# Patient Record
Sex: Female | Born: 1950 | Race: White | Hispanic: No | Marital: Single | Smoking: Current every day smoker
Health system: Southern US, Community
[De-identification: ages and names within clinical notes are randomized; demographics above are authoritative.]

## PROBLEM LIST (undated history)

## (undated) DIAGNOSIS — J40 Bronchitis, not specified as acute or chronic: Secondary | ICD-10-CM

## (undated) DIAGNOSIS — I493 Ventricular premature depolarization: Secondary | ICD-10-CM

## (undated) DIAGNOSIS — IMO0002 Reserved for concepts with insufficient information to code with codable children: Secondary | ICD-10-CM

## (undated) DIAGNOSIS — H6990 Unspecified Eustachian tube disorder, unspecified ear: Secondary | ICD-10-CM

## (undated) DIAGNOSIS — E039 Hypothyroidism, unspecified: Secondary | ICD-10-CM

## (undated) DIAGNOSIS — Z8639 Personal history of other endocrine, nutritional and metabolic disease: Secondary | ICD-10-CM

## (undated) DIAGNOSIS — M329 Systemic lupus erythematosus, unspecified: Secondary | ICD-10-CM

## (undated) DIAGNOSIS — K219 Gastro-esophageal reflux disease without esophagitis: Secondary | ICD-10-CM

## (undated) DIAGNOSIS — H6980 Other specified disorders of Eustachian tube, unspecified ear: Secondary | ICD-10-CM

## (undated) DIAGNOSIS — I499 Cardiac arrhythmia, unspecified: Secondary | ICD-10-CM

## (undated) DIAGNOSIS — T884XXA Failed or difficult intubation, initial encounter: Secondary | ICD-10-CM

## (undated) HISTORY — DX: Unspecified eustachian tube disorder, unspecified ear: H69.90

## (undated) HISTORY — PX: MANDIBLE SURGERY: SHX707

## (undated) HISTORY — PX: LAPAROSCOPIC GASTRIC BANDING WITH HIATAL HERNIA REPAIR: SHX6351

## (undated) HISTORY — PX: LAPAROSCOPIC GASTRIC BANDING: SHX1100

## (undated) HISTORY — DX: Personal history of other endocrine, nutritional and metabolic disease: Z86.39

## (undated) HISTORY — PX: LUMBAR LAMINECTOMY: SHX95

## (undated) HISTORY — PX: TONSILLECTOMY: SUR1361

## (undated) HISTORY — DX: Other specified disorders of Eustachian tube, unspecified ear: H69.80

## (undated) HISTORY — PX: KNEE SURGERY: SHX244

---
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
## 2003-09-30 ENCOUNTER — Emergency Department (HOSPITAL_COMMUNITY): Admission: EM | Admit: 2003-09-30 | Discharge: 2003-09-30 | Payer: Self-pay | Admitting: Emergency Medicine

## 2004-06-11 ENCOUNTER — Encounter: Admission: RE | Admit: 2004-06-11 | Discharge: 2004-06-11 | Payer: Self-pay | Admitting: Surgery

## 2004-10-21 ENCOUNTER — Ambulatory Visit (HOSPITAL_COMMUNITY): Admission: RE | Admit: 2004-10-21 | Discharge: 2004-10-21 | Payer: Self-pay | Admitting: Surgery

## 2004-11-17 ENCOUNTER — Encounter (INDEPENDENT_AMBULATORY_CARE_PROVIDER_SITE_OTHER): Payer: Self-pay | Admitting: *Deleted

## 2004-11-18 ENCOUNTER — Ambulatory Visit (HOSPITAL_COMMUNITY): Admission: RE | Admit: 2004-11-18 | Discharge: 2004-11-18 | Payer: Self-pay | Admitting: Surgery

## 2005-01-18 ENCOUNTER — Encounter: Admission: RE | Admit: 2005-01-18 | Discharge: 2005-04-18 | Payer: Self-pay | Admitting: Surgery

## 2005-03-07 ENCOUNTER — Observation Stay (HOSPITAL_COMMUNITY): Admission: RE | Admit: 2005-03-07 | Discharge: 2005-03-08 | Payer: Self-pay | Admitting: Surgery

## 2005-05-27 ENCOUNTER — Encounter: Admission: RE | Admit: 2005-05-27 | Discharge: 2005-08-25 | Payer: Self-pay | Admitting: Surgery

## 2007-02-08 ENCOUNTER — Ambulatory Visit: Payer: Self-pay | Admitting: Cardiology

## 2007-02-16 ENCOUNTER — Ambulatory Visit: Payer: Self-pay

## 2009-06-26 ENCOUNTER — Encounter: Admission: RE | Admit: 2009-06-26 | Discharge: 2009-06-26 | Payer: Self-pay | Admitting: Surgery

## 2010-01-27 ENCOUNTER — Ambulatory Visit: Payer: Self-pay | Admitting: Infectious Diseases

## 2010-01-27 ENCOUNTER — Inpatient Hospital Stay (HOSPITAL_COMMUNITY): Admission: AD | Admit: 2010-01-27 | Discharge: 2010-01-29 | Payer: Self-pay | Admitting: Surgery

## 2010-02-05 ENCOUNTER — Ambulatory Visit: Payer: Self-pay | Admitting: Infectious Diseases

## 2010-02-05 DIAGNOSIS — M329 Systemic lupus erythematosus, unspecified: Secondary | ICD-10-CM

## 2010-02-05 DIAGNOSIS — L0293 Carbuncle, unspecified: Secondary | ICD-10-CM

## 2010-02-05 DIAGNOSIS — L0292 Furuncle, unspecified: Secondary | ICD-10-CM | POA: Insufficient documentation

## 2010-02-05 DIAGNOSIS — E890 Postprocedural hypothyroidism: Secondary | ICD-10-CM | POA: Insufficient documentation

## 2010-02-05 DIAGNOSIS — E039 Hypothyroidism, unspecified: Secondary | ICD-10-CM

## 2010-02-05 DIAGNOSIS — K219 Gastro-esophageal reflux disease without esophagitis: Secondary | ICD-10-CM | POA: Insufficient documentation

## 2010-02-05 DIAGNOSIS — L930 Discoid lupus erythematosus: Secondary | ICD-10-CM | POA: Insufficient documentation

## 2010-07-20 ENCOUNTER — Telehealth: Payer: Self-pay | Admitting: Infectious Diseases

## 2010-10-07 NOTE — Assessment & Plan Note (Signed)
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 Her immunosuppresive therapy was changed from plaquenil to methotrexate. After her 3 doses she developed significant swelling of her occipital lymph nodes. 2 weeks later she was adm to [facility] and had local I & D 01-27-10 which was polymicrobic (no staph or strep). It was actually listed as "normal vaginal flora". She was d/c home on Badtrim 2 DS two times a day and now returns with continued d/c from her wound. no f/c.   Preventive Screening-Counseling & Management  Alcohol-Tobacco     Smoking Status: current  Past History:  Past Medical History: Discoid Lupus GERD- with Banding Hypothyroidism Funcule- Cx negative Feb 27, 2010  Family History: father died of glioblastoma  Social History: Occupation: Education officer, community Single Alcohol use-yes, 1-2 drinks/wk Current Smoker- >58yrs, <1ppd cigarrettes/day   Review of Systems       no yeast infecions, has had soreness in R ear and sore throat on R. no joint aches.   Vital Signs:  Patient profile:   60 year old female Height:      65 inches (165.10 cm) Weight:      202.8 pounds (92.18 kg) BMI:     33.87 Temp:     98.4 degrees F (36.89 degrees C) oral Pulse rate:   91 / minute BP sitting:   160 / 88  (right arm)  Vitals Entered By: Baxter Hire) (February 05, 2010 9:59 AM)  Physical Exam  General:  well-developed, well-nourished, and well-hydrated.   Eyes:  pupils equal, pupils round, and pupils reactive to light.   Mouth:  pharynx pink and moist and no exudates.   Lungs:  normal respiratory effort and normal breath sounds.   Heart:  normal rate, regular rhythm, and no murmur.   Abdomen:  soft, non-tender, and normal bowel sounds.   Skin:  she has thickened erythematous area over her R elbow.  Her lesion on her R occiput is significantly smaller, decreased erythema, non-tender, no fluctuance.    Impression & Recommendations:  Problem # 1:   FURUNCLE (ICD-680.9)  She is doing much better. She will complete her 2 weeks of by mouth 2 tabs Bactrim DS two times a day. I am not sure why her cx's were negative. It could be that she had a necrotic lymph node from immune reaction from her new lupus therapy. Her exam was most consistent with a MRSA furuncle however. At work, she need to continue to keep the wound covered and practice good hand washing. This is also true if she is around any immunocompromised patients.  she will returnt to the ID clinic as needed.   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 She is not happy with her previous f/u. I have given her the name and phone # for a rheumatologist (Will Truslow). She will think about this.   Orders: New Patient Level IV ([id number])  Medications Added to Medication List This Visit: 1)  Synthroid 25 Mcg Tabs (Levothyroxine sodium) .... Take one tablet by mouth once a day. 2)  Bactrim Ds 800-160 Mg Tabs (Sulfamethoxazole-trimethoprim) .... 2 two times a day

## 2010-10-07 NOTE — Progress Notes (Signed)
  Phone Note Call from Patient Call back at [phone]   Caller: [phone] Summary of Call: Patient called stating that she has another furuncle on her forehead, and she wants to know if she can get some abx before it gets worse. Not  Doxycycline because she is running a marathon. [phone] Initial call taken by: Starleen Arms CMA,  July 20, 2010 9:42 AM    New/Updated Medications: BACTRIM DS 800-160 MG TABS (SULFAMETHOXAZOLE-TRIMETHOPRIM) Take 1 tablet by mouth two times a day Prescriptions: BACTRIM DS 800-160 MG TABS (SULFAMETHOXAZOLE-TRIMETHOPRIM) Take 1 tablet by mouth two times a day  #28 x 1   Entered and Authorized by:   Johny Sax MD   Signed by:   Johny Sax MD on 07/27/2010   Method used:   Print then Give to Patient   RxID:   [id number]

## 2010-10-08 NOTE — Miscellaneous (Signed)
Summary: HIPAA Restrictions  HIPAA Restrictions   Imported By: Florinda Marker 02/05/2010 12:01:58  _____________________________________________________________________  External Attachment:    Type:   Image     Comment:   External Document

## 2010-11-22 LAB — DIFFERENTIAL
Basophils Absolute: 0 10*3/uL (ref 0.0–0.1)
Basophils Absolute: 0 10*3/uL (ref 0.0–0.1)
Basophils Relative: 0 % (ref 0–1)
Basophils Relative: 1 % (ref 0–1)
Eosinophils Absolute: 0.2 10*3/uL (ref 0.0–0.7)
Eosinophils Absolute: 0.2 10*3/uL (ref 0.0–0.7)
Eosinophils Relative: 2 % (ref 0–5)
Eosinophils Relative: 5 % (ref 0–5)
Lymphocytes Relative: 17 % (ref 12–46)
Lymphocytes Relative: 33 % (ref 12–46)
Lymphs Abs: 1.5 10*3/uL (ref 0.7–4.0)
Lymphs Abs: 1.6 10*3/uL (ref 0.7–4.0)
Monocytes Absolute: 0.7 10*3/uL (ref 0.1–1.0)
Monocytes Absolute: 0.8 10*3/uL (ref 0.1–1.0)
Monocytes Relative: 14 % — ABNORMAL HIGH (ref 3–12)
Monocytes Relative: 9 % (ref 3–12)
Neutro Abs: 2.2 10*3/uL (ref 1.7–7.7)
Neutro Abs: 6.4 10*3/uL (ref 1.7–7.7)
Neutrophils Relative %: 47 % (ref 43–77)
Neutrophils Relative %: 71 % (ref 43–77)

## 2010-11-22 LAB — CBC
HCT: 33.3 % — ABNORMAL LOW (ref 36.0–46.0)
HCT: 34.9 % — ABNORMAL LOW (ref 36.0–46.0)
HCT: 36 % (ref 36.0–46.0)
Hemoglobin: 11.2 g/dL — ABNORMAL LOW (ref 12.0–15.0)
Hemoglobin: 11.5 g/dL — ABNORMAL LOW (ref 12.0–15.0)
Hemoglobin: 11.9 g/dL — ABNORMAL LOW (ref 12.0–15.0)
MCHC: 32.9 g/dL (ref 30.0–36.0)
MCHC: 33 g/dL (ref 30.0–36.0)
MCHC: 33.8 g/dL (ref 30.0–36.0)
MCV: 86.7 fL (ref 78.0–100.0)
MCV: 87.3 fL (ref 78.0–100.0)
MCV: 87.6 fL (ref 78.0–100.0)
Platelets: 263 10*3/uL (ref 150–400)
Platelets: 272 10*3/uL (ref 150–400)
Platelets: 273 10*3/uL (ref 150–400)
RBC: 3.84 MIL/uL — ABNORMAL LOW (ref 3.87–5.11)
RBC: 4 MIL/uL (ref 3.87–5.11)
RBC: 4.11 MIL/uL (ref 3.87–5.11)
RDW: 15.6 % — ABNORMAL HIGH (ref 11.5–15.5)
RDW: 15.7 % — ABNORMAL HIGH (ref 11.5–15.5)
RDW: 15.8 % — ABNORMAL HIGH (ref 11.5–15.5)
WBC: 4.7 10*3/uL (ref 4.0–10.5)
WBC: 7.7 10*3/uL (ref 4.0–10.5)
WBC: 8.9 10*3/uL (ref 4.0–10.5)

## 2010-11-22 LAB — COMPREHENSIVE METABOLIC PANEL
ALT: 10 U/L (ref 0–35)
ALT: 10 U/L (ref 0–35)
AST: 11 U/L (ref 0–37)
AST: 12 U/L (ref 0–37)
Albumin: 3.3 g/dL — ABNORMAL LOW (ref 3.5–5.2)
Albumin: 3.4 g/dL — ABNORMAL LOW (ref 3.5–5.2)
Alkaline Phosphatase: 62 U/L (ref 39–117)
Alkaline Phosphatase: 65 U/L (ref 39–117)
BUN: 7 mg/dL (ref 6–23)
BUN: 8 mg/dL (ref 6–23)
CO2: 23 mEq/L (ref 19–32)
CO2: 24 mEq/L (ref 19–32)
Calcium: 8.9 mg/dL (ref 8.4–10.5)
Calcium: 9 mg/dL (ref 8.4–10.5)
Chloride: 108 mEq/L (ref 96–112)
Chloride: 110 mEq/L (ref 96–112)
Creatinine, Ser: 0.75 mg/dL (ref 0.4–1.2)
Creatinine, Ser: 0.82 mg/dL (ref 0.4–1.2)
GFR calc Af Amer: >60 mL/min (ref >60)
GFR calc Af Amer: >60 mL/min (ref >60)
GFR calc non Af Amer: >60 mL/min (ref >60)
GFR calc non Af Amer: >60 mL/min (ref >60)
Glucose, Bld: 101 mg/dL — ABNORMAL HIGH (ref 70–99)
Glucose, Bld: 104 mg/dL — ABNORMAL HIGH (ref 70–99)
Potassium: 3.6 mEq/L (ref 3.5–5.1)
Potassium: 3.8 mEq/L (ref 3.5–5.1)
Sodium: 140 mEq/L (ref 135–145)
Sodium: 142 mEq/L (ref 135–145)
Total Bilirubin: 0.5 mg/dL (ref 0.3–1.2)
Total Bilirubin: 0.6 mg/dL (ref 0.3–1.2)
Total Protein: 6.3 g/dL (ref 6.0–8.3)
Total Protein: 6.4 g/dL (ref 6.0–8.3)

## 2010-11-22 LAB — CULTURE, ROUTINE-ABSCESS: Culture: NORMAL

## 2010-11-22 LAB — PROTIME-INR
INR: 1.08 (ref 0.00–1.49)
Prothrombin Time: 13.9 seconds (ref 11.6–15.2)

## 2010-11-22 LAB — APTT: aPTT: 32 seconds (ref 24–37)

## 2010-11-22 LAB — SEDIMENTATION RATE: Sed Rate: 27 mm/hr — ABNORMAL HIGH (ref 0–22)

## 2010-11-22 LAB — VANCOMYCIN, TROUGH: Vancomycin Tr: 21.5 ug/mL — ABNORMAL HIGH (ref 10.0–20.0)

## 2010-12-14 ENCOUNTER — Encounter: Payer: Self-pay | Admitting: *Deleted

## 2011-01-18 NOTE — Assessment & Plan Note (Signed)
[facility]                            CARDIOLOGY OFFICE NOTE   Angela Scott, Angela Scott                      MRN:          [id number]  DATE:02/08/2007                            DOB:          02/15/1951    PRIMARY CARE PHYSICIAN:  Suszanne Conners, PA-C, with [facility].   REASON FOR CONSULTATION:  Abnormal electrocardiogram.   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  She  has no long standing history of hypertension, hyperlipidemia (reported  total cholesterol around 100), or type 2 diabetes mellitus.  She  recently applied for term life insurance and had a paramedical  examination which included an electrocardiogram that was interpreted as  being abnormal, showing T-wave changes.  I have electrocardiograms today  in the office to review, one dated May the 30th, the other dated April  the 4th, the final one not dated.  The most recent tracing shows sinus  rhythm with an R-prime in V1 and T-wave inversions in the anteroseptal  leads.  There is also a leftward axis, but not frank Q-waves in the  inferior leads.  Generally nonspecific T-wave changes are noted.  The  other tracings show similar changes, although perhaps with less  prominent T-wave changes in general.  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   Symptomatically, the patient denies any clear history of exertional  angina or limiting dyspnea on exertion.  She states that she does some  type of aerobic exercise 2-3 times a week, and notes no chest pain or  breathlessness with this.  She reports that she has occasional  palpitations that are brief and not associated with any dizziness or  syncope.  She has had no prior cardiac risk stratification.   ALLERGIES:   ABSORBABLE SUTURES.  NO FRANK DRUG ALLERGIES.   PRESENT MEDICATIONS:  1. Synthroid 0.25 mg p.o. q.i.d.  2. Cytomel 25 mg p.o. q.p.m.  3. She also takes an occasional multivitamin.   SOCIAL HISTORY:  The patient is single.  She has no children.  She is a  Education officer, community.  She has a 20-year history of less than a pack per day  cigarette use.  Drinks 1 or 2 alcoholic beverages weekly and a few cups  of caffeinated coffee a day.  Reports her exercise as being aerobic,  using a glider, elliptical machine, or walking in general.   FAMILY HISTORY:  Noncontributory for premature cardiovascular disease.  She states that her father died at age 81 with a glioblastoma.  She has  a brother who underwent an electrophysiology ablation in his 32s,  apparently due to some type of preexcitation syndrome (details not  known).   REVIEW OF SYSTEMS:  As described in the history of present illness.  She  has a prior history of hiatal hernia repair, and some mild arthritic  discomfort.  No major reflux symptoms at this time.  PAST MEDICAL HISTORY:  Is as outlined above.  Possible history of  Grave's disease/thyroid disease as outlined.  She did have prior  orthopedic surgery for a torn tendon in her ankle back in 1995.   PHYSICAL EXAMINATION:  Blood pressure is 132/80, heart rate is 81,  weight is 216 pounds.  An overweight woman in no acute distress.  HEENT:  Conjunctivae, lids, grossly normal, oropharynx clear.  NECK:  Supple.  No elevated jugular venous pressure or loud bruits.  Normal carotid pulses.  No thyromegaly or thyroid tenderness is noted.  LUNGS:  Clear without labored breathing at rest.  CARDIAC EXAM:  Reveals a regular rate and rhythm.  No loud murmur,  pericardial rub or S3 gallop.  ABDOMEN:  Soft, nontender.  Normoactive bowel sounds.  No bruits.  EXTREMITIES:  Exhibit no significant pitting edema.  Distal pulses are  2+.  SKIN:  Warm and dry.  MUSCULOSKELETAL:  No kyphosis is noted.   NEURO/PSYCHIATRIC:  The patient is alert and oriented x3.  Affect is  normal.   IMPRESSION/RECOMMENDATIONS:  1. Mildly abnormal resting electrocardiogram, essentially nonspecific      with T-wave inversions as described.  This is in a 60 year old      woman with a history of tobacco use, post menopausal, and mildly      overweight.  She has had no prior cardiac risk stratification.  The      patient is asymptomatic from the perspective of exertional angina      or dyspnea.  It does seem reasonable to proceed with basic cardiac      risk stratification, and we discussed this today.  We will plan an      exercise Myoview, presuming that this is low risk, I would not      anticipate any additional cardiac studies in the absence of      symptoms.  She would, otherwise, be able to continue with her      followup with her primary care Latoia Eyster, and consider reapplication      for a term life insurance.  2. Ongoing tobacco use.  I discussed smoking cessation with her today.      She indicated that she has been considering this more seriously of      late.     Jonelle Sidle, MD  Electronically Signed    SGM/MedQ  DD: 02/08/2007  DT: 02/08/2007  Job #: [id number]   cc:   Suszanne Conners, PA-C

## 2011-01-21 NOTE — Op Note (Signed)
Angela Scott, Angela Scott               ACCOUNT NO.:  [id number]   MEDICAL RECORD NO.:  [id number]          PATIENT TYPE:  AMB   LOCATION:  ENDO                         FACILITY:  [facility]   PHYSICIAN:  Sandria Bales. Ezzard Standing, M.D.  DATE OF BIRTH:  Apr 01, 1951   DATE OF PROCEDURE:  11/18/2004  DATE OF DISCHARGE:                                 OPERATIVE REPORT   PREOPERATIVE DIAGNOSIS:  Hiatal hernia.  Anticipates lap band procedure.   POSTOPERATIVE DIAGNOSIS:  A 3 cm hiatal hernia with distal esophageal  erosions secondary to reflux esophagitis versus glandular dysplasia  (Barrett's).   OPERATION/PROCEDURE:  Esophagogastroduodenoscopy with biopsy of distal  esophagus.  Biopsy of stomach for CLO test.   SURGEON:  Sandria Bales. Ezzard Standing, M.D.   ANESTHESIA:  50 mg Demerol, 5 mg Versed.   COMPLICATIONS:  None.   INDICATIONS FOR PROCEDURE:  Angela Scott is a 60 year old white female  patient of Dr. Susy Frizzle Martin's who is considering laparoscopic banding for  weight control and morbid obesity.  She has had trouble with reflux  esophagitis for some time.  Has tried different agents without much success.  She uses Tums and goes through 10-20 Tums or more a day controlling her  reflux symptoms.   The potential complications of the procedure were explained to the patient.   DESCRIPTION OF PROCEDURE:  She was placed in the left lateral decubitus  position.  The back of her throat had been anesthetized with Cetacaine.  She  was given 50 mg of Demerol and 5 mg of Versed IV.  She was monitored with a  pulse oximeter, EKG, blood pressure cuff, and had nasal oxygen at 2 L  running during the procedure.  A mouth guard was placed in her mouth and a  flexible Olympus endoscope was passed without difficulty down the back of  her throat into her stomach and into the duodenum.   I passed the endoscope at least to the junction of the second and third  portion of the duodenum.  The duodenum and duodenal bulb were all  unremarkable as was her pylorus.  The stomach was unremarkable.  I did  biopsy the stomach wall and sent this off for CLO-test.   In bringing the scope out, I retroflexed the scope and could see a hiatal  hernia from below and from above.  My guess is the hiatal hernia is about 3  cm in length with the Z-line at about 36 cm from the incisors and the  diaphragm opening about 39-40 cm from the incisors.   She also has some evidence of either some distal esophageal erosions which  extended maybe 1 cm to 1.5 cm above the Z-line or this was some evidence of  Barrett's esophagus.  I did four biopsies of this area to see if this was  inflammation versus metaplasia.   The remainder of her esophagus was unremarkable.  Scope was withdrawn.  She  tolerated the procedure well.   She will be in touch with Dr. Daphine Deutscher regarding further treatment of her  reflux esophagitis and the consideration for lap banding procedure.  DHN/MEDQ  D:  11/18/2004  T:  11/19/2004  Job:  [id number]   cc:   Thorn[place] Park Daphine Deutscher, MD  [address]  [place]  [place] [address]

## 2011-01-21 NOTE — Op Note (Signed)
Angela Scott, Angela Scott               ACCOUNT NO.:  [id number]   MEDICAL RECORD NO.:  [id number]          PATIENT TYPE:  AMB   LOCATION:  DAY                          FACILITY:  [facility]   PHYSICIAN:  Thornton Park. Daphine Deutscher, MD  DATE OF BIRTH:  1950/12/18   DATE OF PROCEDURE:  03/07/2005  DATE OF DISCHARGE:                                 OPERATIVE REPORT   PREOPERATIVE DIAGNOSES:  1.  Gastroesophageal reflux with sliding hiatal hernia.  2.  Morbid obesity.   PROCEDURE:  This laparoscopic repair of hiatal hernia with pledgeted closure  posteriorly, calibrated with a 50 lighted bougie, followed by placement of a  10 cm INAMED laparoscopically placed gastric band.   SURGEON:  Thornton Park. Daphine Deutscher, M.D.   ASSISTANT:  Danna Hefty M.D.   ANESTHESIA:  General endotracheal.   OPERATIVE TIME:  Two hours.   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 She  was taken to room one, given general anesthesia on March 07, 2005. Abdomen  was prepped widely with Betadine and draped sterilely. A laparoscopic access  was achieved with the OptiView using a 0 degree camera through the left  upper quadrant without difficulty. The abdomen was insufflated and the  abdomen was surveyed carefully looking at the liver, finding no evidence of  any kind of spots. The gallbladder appeared normal and she had a very large  visceral fat store in her omentum which was very noticeable. The liver  itself did not seem to be too steatotic. The 5 mm port was placed in the  upper midline for the [facility] retractor which was then placed and then an  11 was placed high on the right side followed by 12  below and another 11 to  the left of the umbilicus and then total of two over on the left side  including a 5 most laterally.   The operation began  with incising pars flaccida and then dissecting the  right crus and then going anteriorly and then to the left side to begin a  complete dissection of the esophagogastric junction. Noticeable, was a  prominent hiatal hernia and I noticed at the beginning when I was pulling  down, you could see the sliding hiatal hernia noticed easily anteriorly. I  completely dissected this esophagogastric area and delineated the hiatal  hernia. I then placed two pledgeted sutures posteriorly and after tying  these. we passed with the 50 lighted bougie and it seemed to be very snug  within the closure of the hiatus and I felt that no further closure was  necessary at that point.  This was done essentially with no bleeding and  looked good. I then went down a little farther down on the right side of the  right crus where the fat pad crosses it and created a separate wound and  then easily dissected behind the upper portion of the stomach to an area a  little farther down on the left crus  but still high. We then passed the band  passer without difficulty and brought 10 cm band around up through that  point and then snapped it in place. I had already removed the lighted  bougie, but with this in place it moved nicely and did not appear to be  encumbering on things.  Before doing that, I did take off the fat pad  anteriorly. With the anatomy well exposed, I then pulled it to the right and  down and placed three sutures of 2-0 Ethibond, securing this with lap ties.  All good purchases of stomach were made high near the esophagogastric  junction.   Once these were placed, it looked good and the band slid and appeared to be  mobile and adequate anterior plication but well away from the buckle. The  tibia was then brought out through the right upper quadrant port and  subsequently tunneled to a new window that I created just below that where I  made another opening and secured the port after I had connected it to  the  tubing. This was secured with four 2-0 Prolenes.  The port tended to go in  general attitude toward its insertion site up through the upper trocar site.   The wounds were injected with Marcaine actually while they were being made  and at the end, the areas were washed out with saline. Port sites were all  closed with 4-0 Vicryl, Benzoin Steri-Strips. The patient seemed to tolerate  the procedure well. She was taken to the recovery room in satisfactory  condition.       MBM/MEDQ  D:  03/07/2005  T:  03/07/2005  Job:  [id number]   cc:   Attn: Suszanne Conners, P.A. [facility]  [address] [place] [address]

## 2011-12-28 ENCOUNTER — Telehealth (INDEPENDENT_AMBULATORY_CARE_PROVIDER_SITE_OTHER): Payer: Self-pay | Admitting: General Surgery

## 2011-12-28 NOTE — Telephone Encounter (Signed)
Called patient to advise that her prescription request for Cytomel 25 mcg tablets was called in directly by Dr. Daphine Deutscher to [name] [phone].

## 2012-04-27 ENCOUNTER — Other Ambulatory Visit (INDEPENDENT_AMBULATORY_CARE_PROVIDER_SITE_OTHER): Payer: Self-pay | Admitting: Surgery

## 2012-04-27 NOTE — Telephone Encounter (Signed)
DR. Daphine Deutscher CALLED TO REQUEST THAT A RX REFILL FOR PT'S SYNTHROID BE CALLED INTO PHARMACY/ I CALLED IN SYNTHROID REILL TO [facility]/ [address] [phone] FOR 1 YEAR. DOSAGE SAME AS PREVIOUS RX ON FILE/ MESSAGE LEFT ON PT'S PHONE/GY

## 2012-07-06 ENCOUNTER — Telehealth (INDEPENDENT_AMBULATORY_CARE_PROVIDER_SITE_OTHER): Payer: Self-pay | Admitting: General Surgery

## 2012-07-06 NOTE — Telephone Encounter (Signed)
LMOM for refill of synthroid 200 mcg 1 tablet PO QD #90 refills PRN.

## 2012-11-20 DIAGNOSIS — Z9884 Bariatric surgery status: Secondary | ICD-10-CM | POA: Insufficient documentation

## 2012-11-21 ENCOUNTER — Other Ambulatory Visit (INDEPENDENT_AMBULATORY_CARE_PROVIDER_SITE_OTHER): Payer: Self-pay

## 2012-11-23 ENCOUNTER — Ambulatory Visit (INDEPENDENT_AMBULATORY_CARE_PROVIDER_SITE_OTHER): Payer: PRIVATE HEALTH INSURANCE | Admitting: Surgery

## 2012-11-23 ENCOUNTER — Encounter (INDEPENDENT_AMBULATORY_CARE_PROVIDER_SITE_OTHER): Payer: Self-pay | Admitting: Surgery

## 2012-11-23 ENCOUNTER — Ambulatory Visit
Admission: RE | Admit: 2012-11-23 | Discharge: 2012-11-23 | Disposition: A | Discharge status: Home / Self Care | Payer: PRIVATE HEALTH INSURANCE | Source: Ambulatory Visit | Attending: Surgery | Admitting: Surgery

## 2012-11-23 VITALS — BP 120/82 | HR 76 | Resp 16 | Ht 65.0 in | Wt 169.0 lb

## 2012-11-23 DIAGNOSIS — Z09 Encounter for follow-up examination after completed treatment for conditions other than malignant neoplasm: Secondary | ICD-10-CM

## 2012-11-23 DIAGNOSIS — Z9884 Bariatric surgery status: Secondary | ICD-10-CM

## 2012-11-23 MED ORDER — IOHEXOL 300 MG/ML  SOLN
100.0000 mL | Freq: Once | INTRAMUSCULAR | Status: AC | PRN
  Administered 2012-11-23: 100 mL via INTRAVENOUS

## 2012-11-23 NOTE — Progress Notes (Signed)
CT scan reviewed and no abnormalities noted. The rash over her port I think he is shingles. It looks like he follows I nerve distribution. I will see her back on an as-needed basis. I showed her the CT scan we reviewed this in detail.

## 2012-11-23 NOTE — Patient Instructions (Addendum)
Thanks for your patience.  If you need further assistance after leaving the office, please call our office and speak with a [facility] nurse.  [phone].  If you want to leave a message for Dr. Feliciana Narayan, please call his office phone at [phone]. 

## 2013-01-14 ENCOUNTER — Telehealth (INDEPENDENT_AMBULATORY_CARE_PROVIDER_SITE_OTHER): Payer: Self-pay | Admitting: *Deleted

## 2013-01-14 NOTE — Telephone Encounter (Signed)
Called in prescription for Bactrim 800-160mg  BID #14 for seven days no refills per Daphine Deutscher MD.  When calling it in pharmacist [facility] suggested medication be BID.

## 2013-07-16 DIAGNOSIS — Z8639 Personal history of other endocrine, nutritional and metabolic disease: Secondary | ICD-10-CM | POA: Insufficient documentation

## 2013-08-18 ENCOUNTER — Telehealth (INDEPENDENT_AMBULATORY_CARE_PROVIDER_SITE_OTHER): Payer: Self-pay | Admitting: Surgery

## 2013-08-18 DIAGNOSIS — Z9884 Bariatric surgery status: Secondary | ICD-10-CM

## 2013-08-18 NOTE — Telephone Encounter (Signed)
Angela Scott called and has been having postprandial pain on the left side.  This starts in the LLQ near her ASIS and radiates up to her ribs.  She denies vomiting but has had nausea with this.  I told her that I would recommend an UGI series to look at her band and how it is emptying.  Will schedule.   Matt B. Daphine Deutscher, MD, [facility] [phone] beeper [phone]  08/18/2013 4:41 PM

## 2013-08-19 ENCOUNTER — Telehealth (INDEPENDENT_AMBULATORY_CARE_PROVIDER_SITE_OTHER): Payer: Self-pay | Admitting: General Surgery

## 2013-08-19 NOTE — Telephone Encounter (Signed)
LMOM asking pt to return my call.  This is so that I may inform her that I have her set up for a UGI this Friday 08/23/13 at 8:00 at [facility] located at [address].  I need to explain to her that she must be NPO after midnight.

## 2013-08-19 NOTE — Telephone Encounter (Signed)
Pt called back and I relayed the message

## 2013-08-23 ENCOUNTER — Telehealth (INDEPENDENT_AMBULATORY_CARE_PROVIDER_SITE_OTHER): Payer: Self-pay | Admitting: Surgery

## 2013-08-23 ENCOUNTER — Ambulatory Visit
Admission: RE | Admit: 2013-08-23 | Discharge: 2013-08-23 | Disposition: A | Discharge status: Home / Self Care | Payer: PRIVATE HEALTH INSURANCE | Source: Ambulatory Visit | Attending: Surgery | Admitting: Surgery

## 2013-08-23 DIAGNOSIS — Z9884 Bariatric surgery status: Secondary | ICD-10-CM

## 2013-08-23 NOTE — Telephone Encounter (Signed)
Dr. Daphine Deutscher spoke with the patient to give her the results of her UGI.

## 2014-01-02 ENCOUNTER — Telehealth (INDEPENDENT_AMBULATORY_CARE_PROVIDER_SITE_OTHER): Payer: Self-pay

## 2014-01-02 ENCOUNTER — Other Ambulatory Visit (INDEPENDENT_AMBULATORY_CARE_PROVIDER_SITE_OTHER): Payer: Self-pay

## 2014-01-02 MED ORDER — LEVOTHYROXINE SODIUM 200 MCG PO TABS: 200.0000 ug | ORAL_TABLET | Freq: Every day | ORAL | Status: DC

## 2014-01-02 NOTE — Telephone Encounter (Signed)
RX for Synthroid 200mcg [facility] [phone].  Fax confirmation rec'd.  Updated in meds & orders.

## 2014-06-02 ENCOUNTER — Other Ambulatory Visit (INDEPENDENT_AMBULATORY_CARE_PROVIDER_SITE_OTHER): Payer: Self-pay | Admitting: Surgery

## 2014-06-02 MED ORDER — CYTOMEL 25 MCG PO TABS: 25.0000 ug | ORAL_TABLET | Freq: Every day | ORAL | Status: DC

## 2014-06-02 NOTE — Progress Notes (Signed)
Called and refilled her Cytomel for hypothyroidism

## 2014-12-05 ENCOUNTER — Other Ambulatory Visit: Payer: Self-pay | Admitting: Orthopedic Surgery

## 2014-12-11 ENCOUNTER — Other Ambulatory Visit: Payer: Self-pay | Admitting: Orthopedic Surgery

## 2014-12-30 ENCOUNTER — Encounter (HOSPITAL_BASED_OUTPATIENT_CLINIC_OR_DEPARTMENT_OTHER): Payer: Self-pay | Admitting: *Deleted

## 2015-01-01 ENCOUNTER — Encounter (HOSPITAL_BASED_OUTPATIENT_CLINIC_OR_DEPARTMENT_OTHER): Payer: Self-pay | Admitting: Anesthesiology

## 2015-01-01 ENCOUNTER — Ambulatory Visit (HOSPITAL_BASED_OUTPATIENT_CLINIC_OR_DEPARTMENT_OTHER)
Admission: RE | Admit: 2015-01-01 | Discharge: 2015-01-01 | Disposition: A | Discharge status: Home / Self Care | Payer: PRIVATE HEALTH INSURANCE | Source: Ambulatory Visit | Attending: Orthopedic Surgery | Admitting: Orthopedic Surgery

## 2015-01-01 ENCOUNTER — Ambulatory Visit (HOSPITAL_BASED_OUTPATIENT_CLINIC_OR_DEPARTMENT_OTHER): Payer: PRIVATE HEALTH INSURANCE | Admitting: Anesthesiology

## 2015-01-01 ENCOUNTER — Encounter (HOSPITAL_BASED_OUTPATIENT_CLINIC_OR_DEPARTMENT_OTHER)
Admission: RE | Disposition: A | Discharge status: Home / Self Care | Payer: Self-pay | Source: Ambulatory Visit | Attending: Orthopedic Surgery

## 2015-01-01 DIAGNOSIS — Z79899 Other long term (current) drug therapy: Secondary | ICD-10-CM | POA: Insufficient documentation

## 2015-01-01 DIAGNOSIS — Z881 Allergy status to other antibiotic agents status: Secondary | ICD-10-CM | POA: Insufficient documentation

## 2015-01-01 DIAGNOSIS — E039 Hypothyroidism, unspecified: Secondary | ICD-10-CM | POA: Insufficient documentation

## 2015-01-01 DIAGNOSIS — K219 Gastro-esophageal reflux disease without esophagitis: Secondary | ICD-10-CM | POA: Insufficient documentation

## 2015-01-01 DIAGNOSIS — M329 Systemic lupus erythematosus, unspecified: Secondary | ICD-10-CM | POA: Diagnosis not present

## 2015-01-01 DIAGNOSIS — F17210 Nicotine dependence, cigarettes, uncomplicated: Secondary | ICD-10-CM | POA: Insufficient documentation

## 2015-01-01 DIAGNOSIS — M67442 Ganglion, left hand: Secondary | ICD-10-CM | POA: Insufficient documentation

## 2015-01-01 HISTORY — DX: Hypothyroidism, unspecified: E03.9

## 2015-01-01 HISTORY — PX: MASS EXCISION: SHX2000

## 2015-01-01 HISTORY — DX: Systemic lupus erythematosus, unspecified: M32.9

## 2015-01-01 HISTORY — DX: Gastro-esophageal reflux disease without esophagitis: K21.9

## 2015-01-01 HISTORY — DX: Reserved for concepts with insufficient information to code with codable children: IMO0002

## 2015-01-01 SURGERY — EXCISION MASS
Anesthesia: Monitor Anesthesia Care | Site: Hand | Laterality: Left

## 2015-01-01 MED ORDER — MIDAZOLAM HCL 2 MG/2ML IJ SOLN
INTRAMUSCULAR | Status: AC
  Filled 2015-01-01: qty 2

## 2015-01-01 MED ORDER — CEFAZOLIN SODIUM-DEXTROSE 2-3 GM-% IV SOLR: 2.0000 g | INTRAVENOUS | Status: DC

## 2015-01-01 MED ORDER — CEFAZOLIN SODIUM-DEXTROSE 2-3 GM-% IV SOLR
INTRAVENOUS | Status: AC
  Filled 2015-01-01: qty 50

## 2015-01-01 MED ORDER — IBUPROFEN 100 MG/5ML PO SUSP: 200.0000 mg | Freq: Four times a day (QID) | ORAL | Status: DC | PRN

## 2015-01-01 MED ORDER — OXYCODONE HCL 5 MG/5ML PO SOLN: 5.0000 mg | Freq: Once | ORAL | Status: DC | PRN

## 2015-01-01 MED ORDER — TRAMADOL HCL 50 MG PO TABS: 50.0000 mg | ORAL_TABLET | Freq: Four times a day (QID) | ORAL | Status: DC | PRN

## 2015-01-01 MED ORDER — MEPERIDINE HCL 25 MG/ML IJ SOLN: 6.2500 mg | INTRAMUSCULAR | Status: DC | PRN

## 2015-01-01 MED ORDER — CHLORHEXIDINE GLUCONATE 4 % EX LIQD: 60.0000 mL | Freq: Once | CUTANEOUS | Status: DC

## 2015-01-01 MED ORDER — CEFAZOLIN SODIUM-DEXTROSE 2-3 GM-% IV SOLR
INTRAVENOUS | Status: DC | PRN
  Administered 2015-01-01: 2 g via INTRAVENOUS

## 2015-01-01 MED ORDER — SUCCINYLCHOLINE CHLORIDE 20 MG/ML IJ SOLN
INTRAMUSCULAR | Status: AC
  Filled 2015-01-01: qty 3

## 2015-01-01 MED ORDER — LIDOCAINE HCL (PF) 0.5 % IJ SOLN
INTRAMUSCULAR | Status: DC | PRN
  Administered 2015-01-01: 10 mL via INTRAVENOUS

## 2015-01-01 MED ORDER — LACTATED RINGERS IV SOLN
INTRAVENOUS | Status: DC
  Administered 2015-01-01: 10:00:00 via INTRAVENOUS

## 2015-01-01 MED ORDER — OXYCODONE HCL 5 MG PO TABS: 5.0000 mg | ORAL_TABLET | Freq: Once | ORAL | Status: DC | PRN

## 2015-01-01 MED ORDER — FENTANYL CITRATE (PF) 100 MCG/2ML IJ SOLN
INTRAMUSCULAR | Status: DC | PRN
  Administered 2015-01-01 (×2): 50 ug via INTRAVENOUS

## 2015-01-01 MED ORDER — KETOROLAC TROMETHAMINE 30 MG/ML IJ SOLN: 30.0000 mg | Freq: Once | INTRAMUSCULAR | Status: DC | PRN

## 2015-01-01 MED ORDER — FENTANYL CITRATE (PF) 100 MCG/2ML IJ SOLN: 50.0000 ug | INTRAMUSCULAR | Status: DC | PRN

## 2015-01-01 MED ORDER — ONDANSETRON HCL 4 MG/2ML IJ SOLN
INTRAMUSCULAR | Status: DC | PRN
Start: 2015-01-01 — End: 2015-01-01
  Administered 2015-01-01: 4 mg via INTRAVENOUS

## 2015-01-01 MED ORDER — HYDROMORPHONE HCL 1 MG/ML IJ SOLN: 0.2500 mg | INTRAMUSCULAR | Status: DC | PRN

## 2015-01-01 MED ORDER — PROPOFOL INFUSION 10 MG/ML OPTIME
INTRAVENOUS | Status: DC | PRN
  Administered 2015-01-01: 50 ug/kg/min via INTRAVENOUS

## 2015-01-01 MED ORDER — GLYCOPYRROLATE 0.2 MG/ML IJ SOLN: 0.2000 mg | Freq: Once | INTRAMUSCULAR | Status: DC | PRN

## 2015-01-01 MED ORDER — FENTANYL CITRATE (PF) 100 MCG/2ML IJ SOLN
INTRAMUSCULAR | Status: AC
  Filled 2015-01-01: qty 6

## 2015-01-01 MED ORDER — IBUPROFEN 200 MG PO TABS: 200.0000 mg | ORAL_TABLET | Freq: Four times a day (QID) | ORAL | Status: DC | PRN

## 2015-01-01 MED ORDER — MIDAZOLAM HCL 2 MG/2ML IJ SOLN
1.0000 mg | INTRAMUSCULAR | Status: DC | PRN
Start: 2015-01-01 — End: 2015-01-01

## 2015-01-01 MED ORDER — MIDAZOLAM HCL 5 MG/5ML IJ SOLN
INTRAMUSCULAR | Status: DC | PRN
  Administered 2015-01-01: 2 mg via INTRAVENOUS

## 2015-01-01 SURGICAL SUPPLY — 44 items
BANDAGE COBAN STERILE 2 (GAUZE/BANDAGES/DRESSINGS) ×3 IMPLANT
BLADE SURG 15 STRL LF DISP TIS (BLADE) ×1 IMPLANT
BLADE SURG 15 STRL SS (BLADE) ×3
BNDG CMPR 9X4 STRL LF SNTH (GAUZE/BANDAGES/DRESSINGS)
BNDG COHESIVE 1X5 TAN STRL LF (GAUZE/BANDAGES/DRESSINGS) IMPLANT
BNDG COHESIVE 3X5 TAN STRL LF (GAUZE/BANDAGES/DRESSINGS) IMPLANT
BNDG ESMARK 4X9 LF (GAUZE/BANDAGES/DRESSINGS) IMPLANT
BNDG GAUZE ELAST 4 BULKY (GAUZE/BANDAGES/DRESSINGS) IMPLANT
CHLORAPREP W/TINT 26ML (MISCELLANEOUS) ×3 IMPLANT
CORDS BIPOLAR (ELECTRODE) ×3 IMPLANT
COVER BACK TABLE 60X90IN (DRAPES) ×3 IMPLANT
COVER MAYO STAND STRL (DRAPES) ×3 IMPLANT
CUFF TOURNIQUET SINGLE 18IN (TOURNIQUET CUFF) ×3 IMPLANT
DECANTER SPIKE VIAL GLASS SM (MISCELLANEOUS) IMPLANT
DRAIN PENROSE 1/2X12 LTX STRL (WOUND CARE) IMPLANT
DRAPE EXTREMITY T 121X128X90 (DRAPE) ×3 IMPLANT
DRAPE SURG 17X23 STRL (DRAPES) ×3 IMPLANT
GAUZE SPONGE 4X4 12PLY STRL (GAUZE/BANDAGES/DRESSINGS) ×3 IMPLANT
GAUZE XEROFORM 1X8 LF (GAUZE/BANDAGES/DRESSINGS) ×3 IMPLANT
GLOVE BIOGEL PI IND STRL 7.0 (GLOVE) IMPLANT
GLOVE BIOGEL PI IND STRL 8.5 (GLOVE) ×1 IMPLANT
GLOVE BIOGEL PI INDICATOR 7.0 (GLOVE) ×6
GLOVE BIOGEL PI INDICATOR 8.5 (GLOVE) ×2
GLOVE ECLIPSE 6.5 STRL STRAW (GLOVE) ×3 IMPLANT
GLOVE SURG ORTHO 8.0 STRL STRW (GLOVE) ×3 IMPLANT
GLOVE SURG SS PI 7.0 STRL IVOR (GLOVE) ×2 IMPLANT
GOWN STRL REUS W/ TWL LRG LVL3 (GOWN DISPOSABLE) ×2 IMPLANT
GOWN STRL REUS W/TWL LRG LVL3 (GOWN DISPOSABLE) ×6
GOWN STRL REUS W/TWL XL LVL3 (GOWN DISPOSABLE) ×3 IMPLANT
NDL SAFETY ECLIPSE 18X1.5 (NEEDLE) IMPLANT
NEEDLE HYPO 18GX1.5 SHARP (NEEDLE)
NEEDLE PRECISIONGLIDE 27X1.5 (NEEDLE) IMPLANT
NS IRRIG 1000ML POUR BTL (IV SOLUTION) ×3 IMPLANT
PACK BASIN DAY SURGERY FS (CUSTOM PROCEDURE TRAY) ×3 IMPLANT
PAD CAST 3X4 CTTN HI CHSV (CAST SUPPLIES) IMPLANT
PADDING CAST COTTON 3X4 STRL (CAST SUPPLIES)
SPLINT PLASTER CAST XFAST 3X15 (CAST SUPPLIES) IMPLANT
SPLINT PLASTER XTRA FASTSET 3X (CAST SUPPLIES)
STOCKINETTE 4X48 STRL (DRAPES) ×3 IMPLANT
SUT VIC AB 4-0 P2 18 (SUTURE) IMPLANT
SYR BULB 3OZ (MISCELLANEOUS) ×3 IMPLANT
SYR CONTROL 10ML LL (SYRINGE) IMPLANT
TOWEL OR 17X24 6PK STRL BLUE (TOWEL DISPOSABLE) ×3 IMPLANT
UNDERPAD 30X30 (UNDERPADS AND DIAPERS) IMPLANT

## 2015-01-01 NOTE — Brief Op Note (Signed)
01/01/2015  10:43 AM  PATIENT:  Angela Scott  64 y.o. female  PRE-OPERATIVE DIAGNOSIS:  MASS LEFT THUMB/INDEX WEB SPACE  POST-OPERATIVE DIAGNOSIS:  MASS LEFT THUMB/INDEX WEB SPACE  PROCEDURE:  Procedure(s): EXCISION MASS LEFT 1ST WEB SPACE (Left)  SURGEON:  Surgeon(s) and Role:    * Cindee SaltGary Devonne Kitchen, MD - Primary  PHYSICIAN ASSISTANT:   ASSISTANTS: none   ANESTHESIA:   regional  EBL:  Total I/O In: 400 [I.V.:400] Out: -   BLOOD ADMINISTERED:none  DRAINS: none   LOCAL MEDICATIONS USED:  NONE  SPECIMEN:  Excision  DISPOSITION OF SPECIMEN:  PATHOLOGY  COUNTS:  YES  TOURNIQUET:   Total Tourniquet Time Documented: Forearm (Left) - 19 minutes Total: Forearm (Left) - 19 minutes   DICTATION: .Other Dictation: Dictation Number [id number]  PLAN OF CARE: Discharge to home after PACU  PATIENT DISPOSITION:  PACU - hemodynamically stable.

## 2015-01-01 NOTE — Discharge Instructions (Addendum)
[facility] Instructions °Hand Surgery ° °Wound Care: °Keep your hand elevated above the level of your heart.  Do not allow it to dangle by your side.  Keep the dressing dry and do not remove it unless your doctor advises you to do so.  He will usually change it at the time of your post-op visit.  Moving your fingers is advised to stimulate circulation but will depend on the site of your surgery.  If you have a splint applied, your doctor will advise you regarding movement. ° °Activity: °Do not drive or operate machinery today.  Rest today and then you may return to your normal activity and work as indicated by your physician. ° °Diet:  °Drink liquids today or eat a light diet.  You may resume a regular diet tomorrow.   ° °General expectations: °Pain for two to three days. °Fingers may become slightly swollen. ° °Call your doctor if any of the following occur: °Severe pain not relieved by pain medication. °Elevated temperature. °Dressing soaked with blood. °Inability to move fingers. °White or bluish color to fingers. ° ° °Post Anesthesia Home Care Instructions ° °Activity: °Get plenty of rest for the remainder of the day. A responsible adult should stay with you for 24 hours following the procedure.  °For the next 24 hours, DO NOT: °-Drive a car °-Operate machinery °-Drink alcoholic beverages °-Take any medication unless instructed by your physician °-Make any legal decisions or sign important papers. ° °Meals: °Start with liquid foods such as gelatin or soup. Progress to regular foods as tolerated. Avoid greasy, spicy, heavy foods. If nausea and/or vomiting occur, drink only clear liquids until the nausea and/or vomiting subsides. Call your physician if vomiting continues. ° °Special Instructions/Symptoms: °Your throat may feel dry or sore from the anesthesia or the breathing tube placed in your throat during surgery. If this causes discomfort, gargle with warm salt water. The discomfort should disappear within 24  hours. ° °If you had a scopolamine patch placed behind your ear for the management of post- operative nausea and/or vomiting: ° °1. The medication in the patch is effective for 72 hours, after which it should be removed.  Wrap patch in a tissue and discard in the trash. Wash hands thoroughly with soap and water. °2. You may remove the patch earlier than 72 hours if you experience unpleasant side effects which may include dry mouth, dizziness or visual disturbances. °3. Avoid touching the patch. Wash your hands with soap and water after contact with the patch. °  °Call your surgeon if you experience:  ° °1.  Fever over 101.0. °2.  Inability to urinate. °3.  Nausea and/or vomiting. °4.  Extreme swelling or bruising at the surgical site. °5.  Continued bleeding from the incision. °6.  Increased pain, redness or drainage from the incision. °7.  Problems related to your pain medication. °8. Any change in color, movement and/or sensation °9. Any problems and/or concerns ° ° °

## 2015-01-01 NOTE — Anesthesia Postprocedure Evaluation (Signed)
  Anesthesia Post-op Note  Patient: Angela Scott  Procedure(s) Performed: Procedure(s): EXCISION MASS LEFT 1ST WEB SPACE (Left)  Patient Location: PACU  Anesthesia Type: Bier Block, MAC   Level of Consciousness: awake, alert  and oriented  Airway and Oxygen Therapy: Patient Spontanous Breathing  Post-op Pain: none  Post-op Assessment: Post-op Vital signs reviewed  Post-op Vital Signs: Reviewed  Last Vitals:  Filed Vitals:   01/01/15 1207  BP: 150/81  Pulse: 77  Temp: 36.7 C  Resp: 18    Complications: No apparent anesthesia complications

## 2015-01-01 NOTE — Anesthesia Procedure Notes (Signed)
Procedure Name: MAC Date/Time: 01/01/2015 10:15 AM Performed by: [place] DesanctisLINKA, Rogers Ditter L Pre-anesthesia Checklist: Patient identified, Timeout performed, Emergency Drugs available, Suction available and Patient being monitored Patient Re-evaluated:Patient Re-evaluated prior to inductionOxygen Delivery Method: Simple face mask

## 2015-01-01 NOTE — Anesthesia Preprocedure Evaluation (Signed)
Anesthesia Evaluation  Patient identified by MRN, date of birth, ID band Patient awake    Reviewed: Allergy & Precautions, NPO status , Patient's Chart, lab work & pertinent test results  Airway Mallampati: I  TM Distance: >3 FB Neck ROM: Full    Dental  (+) Teeth Intact, Dental Advisory Given   Pulmonary Current Smoker,  breath sounds clear to auscultation        Cardiovascular Rhythm:Regular Rate:Normal     Neuro/Psych    GI/Hepatic GERD-  Medicated and Controlled,  Endo/Other  Hypothyroidism   Renal/GU      Musculoskeletal   Abdominal   Peds  Hematology   Anesthesia Other Findings   Reproductive/Obstetrics                             Anesthesia Physical Anesthesia Plan  ASA: I  Anesthesia Plan: Bier Block and MAC   Post-op Pain Management:    Induction: Intravenous  Airway Management Planned: Simple Face Mask  Additional Equipment:   Intra-op Plan:   Post-operative Plan:   Informed Consent: I have reviewed the patients History and Physical, chart, labs and discussed the procedure including the risks, benefits and alternatives for the proposed anesthesia with the patient or authorized representative who has indicated his/her understanding and acceptance.   Dental advisory given  Plan Discussed with: CRNA, Anesthesiologist and Surgeon  Anesthesia Plan Comments:         Anesthesia Quick Evaluation

## 2015-01-01 NOTE — Transfer of Care (Signed)
Immediate Anesthesia Transfer of Care Note  Patient: Angela Scott  Procedure(s) Performed: Procedure(s): EXCISION MASS LEFT 1ST WEB SPACE (Left)  Patient Location: PACU  Anesthesia Type:Bier block  Level of Consciousness: awake, oriented and patient cooperative  Airway & Oxygen Therapy: Patient Spontanous Breathing and Patient connected to face mask oxygen  Post-op Assessment: Report given to RN and Post -op Vital signs reviewed and stable  Post vital signs: Reviewed and stable  Last Vitals:  Filed Vitals:   01/01/15 1130  BP:   Pulse: 68  Temp:   Resp: 18    Complications: No apparent anesthesia complications

## 2015-01-01 NOTE — Op Note (Signed)
Dictation Number [id number]

## 2015-01-01 NOTE — H&P (Signed)
Dr. [name] is a 89 right-hand dominant female, she is a Education officer, communitydentist.  She presents today complaining of a mass in the left thumb index web space for the past month.  She has no history of injury that she can recall.  She works out in [employer] lifting weights. She started this about three months ago.  Her other complaint is her hands feeling slightly weak and dropping instruments particularly from her left hand.  She is not diabetic.  ALLERGIES:   Clindamycin.  MEDICATIONS: Synthroid, unknown dose, PO q day.   SURGICAL/HOSPITALIZATION HISTORY:  Lymphadenopathy secondary to her methotrexate.  Right ankle tendon surgery, right knee surgery, hiatal hernia, jaw surgery.  FAMILY MEDICAL HISTORY:    No history of diabetes, heart disease, hypertension.  She does have arthritis in her mother.  SOCIAL HISTORY:      She does not smoke.  She consumes about one alcohol based beverage a week.  She is single, [place]  REVIEW OF SYSTEMS:     Positive for contacts and corrective lenses.  She has a remote history of discoid lupus, but she is on no medications for this at this time.  She was previously on methotrexate for this problem, but had a severe reaction to it after only about four doses in the form of severe lymphadenopathy.  After discontinuing the medication this symptom went away,. Angela Scott is an 64 y.o. female.   Chief Complaint: massleft thumb HPI: see above  Past Medical History  Diagnosis Date  . Hypothyroidism   . Lupus     no meds  . GERD (gastroesophageal reflux disease)     no current meds    Past Surgical History  Procedure Laterality Date  . Laparoscopic gastric banding    . Knee surgery Left   . Mandible surgery    . Laparoscopic gastric banding with hiatal hernia repair      History reviewed. No pertinent family history. Social History:  reports that she has been smoking.  She has never used smokeless tobacco. She reports that she drinks  alcohol. She reports that she does not use illicit drugs.  Allergies:  Allergies  Allergen Reactions  . Clindamycin/Lincomycin     No prescriptions prior to admission    No results found for this or any previous visit (from the past 48 hour(s)).  No results found.   Pertinent items are noted in HPI.  Height 5\' 5"  (1.651 m), weight 74.844 kg (165 lb).  General appearance: alert, cooperative and appears stated age Head: Normocephalic, without obvious abnormality Neck: no JVD Resp: clear to auscultation bilaterally Cardio: regular rate and rhythm, S1, S2 normal, no murmur, click, rub or gallop GI: soft, non-tender; bowel sounds normal; no masses,  no organomegaly Extremities: mass first web space left hand Pulses: 2+ and symmetric Skin: Skin color, texture, turgor normal. 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  The mass is visible on x-ray.    DIAGNOSIS:     Calcific mass left thumb, index web space.  She has had her MRI done and this reveals a ganglion cyst measuring 10 x 14 mm. from the metacarpophalangeal joint of her thumb, left hand.  We have had thorough discussion of this with her.  She is aware of the possibility of surgical excision which she would like to have done. She  is aware that there is no guarantee with the surgery, the possibility of infection, recurrence, injury to arteries, nerves, tendons, incomplete relief of symptoms and dystrophy.   She is allergic to absorbable sutures.  This will be scheduled at her convenience as an outpatient for excision mass, first web space, left hand.  Sharon Rubis R 01/01/2015, 7:48 AM

## 2015-01-02 ENCOUNTER — Encounter (HOSPITAL_BASED_OUTPATIENT_CLINIC_OR_DEPARTMENT_OTHER): Payer: Self-pay | Admitting: Orthopedic Surgery

## 2015-01-02 NOTE — Op Note (Signed)
NAMGeorganna Skeans:  Hancock, Roquel               ACCOUNT NO.:  [id number]  MEDICAL RECORD NO.:  [id number]  LOCATION:                                 FACILITY:  PHYSICIAN:  Cindee SaltGary Maks Cavallero, M.D.            DATE OF BIRTH:  DATE OF PROCEDURE:  01/01/2015 DATE OF DISCHARGE:                              OPERATIVE REPORT   PREOPERATIVE DIAGNOSIS:  Mass first webspace, left hand.  POSTOPERATIVE DIAGNOSIS:  Mass first webspace, left hand.  OPERATION:  Excision of the mass, first webspace, left hand with opening of the metacarpophalangeal joint, thumb.  SURGEON:  Cindee SaltGary Thomasa Heidler, M.D.  ANESTHESIA:  Forearm IV regional.  ANESTHESIOLOGIST:  Sheldon Silvanavid Crews, M.D.  HISTORY:  The patient is a 64 year old female with a history of a mass in the first webspace of the left hand metacarpophalangeal joint level of her thumb.  MRI reveals this is probably cystic.  She desires having it removed and it has become painful for her in her line of work.  She is aware that there is no guarantee with the surgery, possibility of infection; recurrence of injury to arteries, nerves, tendons, incomplete relief of symptoms, and dystrophy.  In the preoperative area, the patient is seen, the extremity marked by both patient and surgeon. Antibiotic given.  PROCEDURE IN DETAIL:  The patient was brought to the operating room, where forearm-based IV regional anesthetic was carried out without difficulty.  She was prepped using ChloraPrep, supine position with the left arm free.  After adequate anesthesia was afforded, time-out taken. A transverse incision was made just dorsal to the webspace full, carried down through subcutaneous tissue.  A cystic mass was immediately encountered within the adductor muscle.  This was bluntly dissected free followed down to the metacarpophalangeal joint, which was opened to allow scarring the egress of the cystic formation.  The cyst was sent to Pathology.  The wound was copiously irrigated with saline.   The skin then closed with interrupted 4-0 nylon sutures.  A sterile compressive dressing was applied.  On deflation of the tourniquet, all fingers immediately pinked.  She was taken to the recovery room for observation in a satisfactory condition.  She will be discharged home [facility] [place] [facility] week on tramadol.          ______________________________ Cindee SaltGary Kieron Kantner, M.D.     GK/MEDQ  D:  01/01/2015  T:  01/02/2015  Job:  [id number]

## 2015-01-08 ENCOUNTER — Encounter: Payer: Self-pay | Admitting: Cardiology

## 2015-12-11 ENCOUNTER — Other Ambulatory Visit: Payer: Self-pay | Admitting: Surgery

## 2015-12-11 DIAGNOSIS — E034 Atrophy of thyroid (acquired): Secondary | ICD-10-CM

## 2017-07-19 ENCOUNTER — Encounter (HOSPITAL_BASED_OUTPATIENT_CLINIC_OR_DEPARTMENT_OTHER): Payer: Self-pay

## 2017-07-19 ENCOUNTER — Emergency Department (HOSPITAL_BASED_OUTPATIENT_CLINIC_OR_DEPARTMENT_OTHER)
Admission: EM | Admit: 2017-07-19 | Discharge: 2017-07-19 | Disposition: A | Discharge status: Home / Self Care | Payer: No Typology Code available for payment source | Attending: Emergency Medicine | Admitting: Emergency Medicine

## 2017-07-19 ENCOUNTER — Other Ambulatory Visit: Payer: Self-pay

## 2017-07-19 DIAGNOSIS — M79605 Pain in left leg: Secondary | ICD-10-CM | POA: Diagnosis not present

## 2017-07-19 DIAGNOSIS — S161XXA Strain of muscle, fascia and tendon at neck level, initial encounter: Secondary | ICD-10-CM | POA: Diagnosis not present

## 2017-07-19 DIAGNOSIS — R51 Headache: Secondary | ICD-10-CM | POA: Diagnosis not present

## 2017-07-19 DIAGNOSIS — Z79899 Other long term (current) drug therapy: Secondary | ICD-10-CM | POA: Insufficient documentation

## 2017-07-19 DIAGNOSIS — Y92481 Parking lot as the place of occurrence of the external cause: Secondary | ICD-10-CM | POA: Diagnosis not present

## 2017-07-19 DIAGNOSIS — E039 Hypothyroidism, unspecified: Secondary | ICD-10-CM | POA: Insufficient documentation

## 2017-07-19 DIAGNOSIS — R519 Headache, unspecified: Secondary | ICD-10-CM

## 2017-07-19 DIAGNOSIS — S199XXA Unspecified injury of neck, initial encounter: Secondary | ICD-10-CM | POA: Diagnosis present

## 2017-07-19 DIAGNOSIS — Y999 Unspecified external cause status: Secondary | ICD-10-CM | POA: Insufficient documentation

## 2017-07-19 DIAGNOSIS — Y9389 Activity, other specified: Secondary | ICD-10-CM | POA: Diagnosis not present
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
## 2017-07-19 NOTE — Discharge Instructions (Signed)
Please return for worsening symptoms °

## 2017-07-19 NOTE — ED Notes (Signed)
ED Provider at bedside. 
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
## 2017-12-05 ENCOUNTER — Other Ambulatory Visit (HOSPITAL_COMMUNITY): Payer: Self-pay | Admitting: Surgery

## 2017-12-05 DIAGNOSIS — R1310 Dysphagia, unspecified: Secondary | ICD-10-CM

## 2017-12-08 ENCOUNTER — Ambulatory Visit (HOSPITAL_COMMUNITY)
Admission: RE | Admit: 2017-12-08 | Discharge: 2017-12-08 | Disposition: A | Discharge status: Home / Self Care | Payer: Medicare Other | Source: Ambulatory Visit | Attending: Surgery | Admitting: Surgery

## 2017-12-08 DIAGNOSIS — R1310 Dysphagia, unspecified: Secondary | ICD-10-CM | POA: Insufficient documentation

## 2018-02-06 ENCOUNTER — Encounter: Payer: Self-pay | Admitting: Cardiology

## 2018-02-06 NOTE — Progress Notes (Signed)
Cardiology Office Note  Date: 02/09/2018   ID: Angela Scott, DOB Nov 24, 1950, MRN [id number]  PCP: Patient, No Pcp Per  Consulting Cardiologist: Nona DellSamuel McDowell, MD   Chief Complaint  Patient presents with  . Palpitations  . 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 Luretha MurphyMatthew Martin for evaluation of palpitations and fatigue.  She has [place].  States that she generally stays active, in fact has run several half marathons and 15K's, her last race was about 2 years ago.  Over the last 6 months she has been somewhat more fatigued, taking an over-the-counter supplement try to help her energy.  Within the last 3 months she has felt intermittent palpitations, description consistent with PVCs, forceful heartbeat and sometimes a skip.  She never has any lightheadedness or frank syncope associated with this.  No prolonged rapid palpitations, only an occasional flutter sensation.  She still runs on the treadmill about 2 days a week typically at 5 miles an hour for about 45 minutes.  She does not feel palpitations at this time.  She states that when she has checked her pulse manually she sometimes has felt it as low as "40" but this was during a time with probable PVCs and I suspect she was undercounting her actual pulse with decreased transmission of PVCs to the periphery.  I personally reviewed her ECG today which shows sinus rhythm with PVCs in pattern of bigeminy and incomplete right bundle branch block.  Showed her the tracing today.  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  We went over her medications.  She reports good control of hypothyroidism with recent TSH and consistent use of Synthroid.  Blood pressure was elevated today, we discussed this and she states that this is generally never the case when she checks it.  She has no standing history of  hypertension.  Also no history of cardiomyopathy.  Past Medical History:  Diagnosis Date  . Eustachian tube dysfunction   . GERD (gastroesophageal reflux disease)   . History of Graves' disease   . Hypothyroidism     Past Surgical History:  Procedure Laterality Date  . KNEE SURGERY Left   . LAPAROSCOPIC GASTRIC BANDING    . LAPAROSCOPIC GASTRIC BANDING WITH HIATAL HERNIA REPAIR    . MANDIBLE SURGERY    . MASS EXCISION Left 01/01/2015   Procedure: EXCISION MASS LEFT 1ST WEB SPACE;  Surgeon: Cindee SaltGary Kuzma, MD;  Location: [facility];  Service: Orthopedics;  Laterality: Left;    Current Outpatient Medications  Medication Sig Dispense Refill  . CYTOMEL 25 MCG tablet Take 1 tablet (25 mcg total) by mouth daily. 90 tablet 3  . ibuprofen (ADVIL,MOTRIN) 200 MG tablet Take 200 mg by mouth every 6 (six) hours as needed (takes 800 mg).    . SYNTHROID 200 MCG tablet TAKE 1 TABLET DAILY 30 each 12  . TOBRAMYCIN OP Apply to eye.     No current facility-administered medications for this visit.    Allergies:  Clindamycin/lincomycin and Methotrexate derivatives   Social History: The patient  reports that she has quit smoking. Her smoking use included cigarettes. She smoked 1.00 pack per day. She has never used smokeless tobacco. She reports that she drinks alcohol. She reports that she does not use drugs.   Family History: The patient's family history includes Arrhythmia in her  brother; Brain cancer in her father.   ROS:  Please see the history of present illness. Otherwise, complete review of systems is positive for none.  All other systems are reviewed and negative.   Physical Exam: VS:  BP (!) 158/90 (BP Location: Right Arm)   Pulse 74   Ht 5\' 5"  (1.651 m)   Wt 205 lb (93 kg)   SpO2 98%   BMI 34.11 kg/m , BMI Body mass index is 34.11 kg/m.  Wt Readings from Last 3 Encounters:  02/09/18 205 lb (93 kg)  07/19/17 195 lb (88.5 kg)  01/01/15 187 lb (84.8 kg)    General:  Patient appears comfortable at rest. HEENT: Conjunctiva and lids normal, oropharynx clear. Neck: Supple, no elevated JVP or carotid bruits, no thyromegaly. Lungs: Clear to auscultation, nonlabored breathing at rest. Cardiac: Regular rate and rhythm with intermittent ectopy consistent with PVCs, no S3, soft systolic murmur, no pericardial rub. Abdomen: Soft, nontender, bowel sounds present. Extremities: No pitting edema, distal pulses 2+. Skin: Warm and dry. Musculoskeletal: No kyphosis. Neuropsychiatric: Alert and oriented x3, affect grossly appropriate.  ECG: I personally reviewed the tracing from 02/02/2007 which showed sinus rhythm with nonspecific ST-T wave changes.  Other Studies Reviewed Today:  Exercise Myoview 01/16/2007: No diagnostic ST segment changes, rare PAC.  Normal myocardial perfusion with LVEF 55%.  Assessment and Plan:  1.  Palpitations with documented PVCs on today's ECG in pattern of bigeminy, positive in the inferior leads (right sided superior origin).  She has had no chest pain or syncope.  Does describe generalized fatigue, not clear whether this is specifically related or not at this point.  For now I recommended that she stop the over-the-counter supplement she has been taking to see if this is having any bearing on her PVCs.  We will also have her wear a 24-hour Holter monitor to quantify PVCs further as well as an echocardiogram for assessment of LV and RV function.  She states that she has one brother who had some type of ablation and also another brother who died suddenly in his early 37s without obvious cause.  No medications being started at this time.  We will bring her back for further discussion.  2.  Question of intermittent bradycardia I suspect is related to undercounting of pulse with ineffective transmission of PVCs to the periphery.  PR interval is prolonged and she has an incomplete right bundle branch block.  This does not exclude conduction system  disease however and may be further evaluated by cardiac monitor.  3.  Elevated blood pressure today without history of hypertension.  I have asked her to keep track of this.  4.  Hypothyroidism on Synthroid.  Current medicines were reviewed with the patient today.   Orders Placed This Encounter  Procedures  . Holter monitor - 24 hour  . EKG 12-Lead  . ECHOCARDIOGRAM COMPLETE    Disposition: Follow-up to discuss test results.  Signed, Jonelle Sidle, MD, [facility] 02/09/2018 2:10 PM    [facility] at [facility] [address], [place], [place] [address] Phone: [phone]; Fax: [phone]

## 2018-02-09 ENCOUNTER — Ambulatory Visit (INDEPENDENT_AMBULATORY_CARE_PROVIDER_SITE_OTHER): Payer: Medicare Other | Admitting: Cardiology

## 2018-02-09 ENCOUNTER — Encounter: Payer: Self-pay | Admitting: Cardiology

## 2018-02-09 VITALS — BP 158/90 | HR 74 | Ht 65.0 in | Wt 205.0 lb

## 2018-02-09 DIAGNOSIS — R5383 Other fatigue: Secondary | ICD-10-CM

## 2018-02-09 DIAGNOSIS — R001 Bradycardia, unspecified: Secondary | ICD-10-CM | POA: Diagnosis not present

## 2018-02-09 DIAGNOSIS — R030 Elevated blood-pressure reading, without diagnosis of hypertension: Secondary | ICD-10-CM | POA: Diagnosis not present

## 2018-02-09 DIAGNOSIS — E039 Hypothyroidism, unspecified: Secondary | ICD-10-CM

## 2018-02-09 DIAGNOSIS — I493 Ventricular premature depolarization: Secondary | ICD-10-CM | POA: Diagnosis not present

## 2018-02-09 NOTE — Patient Instructions (Addendum)
Your physician wants you to follow-up in:3-4 weeks with Dr.McDowell    Your physician has requested that you have an echocardiogram. Echocardiography is a painless test that uses sound waves to create images of your heart. It provides your doctor with information about the size and shape of your heart and how well your heart's chambers and valves are working. This procedure takes approximately one hour. There are no restrictions for this procedure.   After stopping supplement, in 1 week wear a 24 hr holter monitor    All other medications stay the same.    If you need a refill on your cardiac medications before your next appointment, please call your pharmacy.     No lab work ordered today       Thank you for choosing [facility] !

## 2018-02-19 ENCOUNTER — Ambulatory Visit (HOSPITAL_COMMUNITY)
Admission: RE | Admit: 2018-02-19 | Discharge: 2018-02-19 | Disposition: A | Discharge status: Home / Self Care | Payer: Medicare Other | Source: Ambulatory Visit | Attending: Cardiology | Admitting: Cardiology

## 2018-02-19 DIAGNOSIS — I493 Ventricular premature depolarization: Secondary | ICD-10-CM | POA: Diagnosis not present

## 2018-02-19 DIAGNOSIS — E039 Hypothyroidism, unspecified: Secondary | ICD-10-CM | POA: Insufficient documentation

## 2018-02-19 DIAGNOSIS — M329 Systemic lupus erythematosus, unspecified: Secondary | ICD-10-CM | POA: Insufficient documentation

## 2018-02-19 DIAGNOSIS — K219 Gastro-esophageal reflux disease without esophagitis: Secondary | ICD-10-CM | POA: Insufficient documentation

## 2018-02-19 NOTE — Progress Notes (Signed)
*  PRELIMINARY RESULTS* Echocardiogram 2D Echocardiogram has been performed.  Stacey DrainWhite, Easter Schinke J 02/19/2018, 10:18 AM

## 2018-03-15 NOTE — Progress Notes (Signed)
Cardiology Office Note  Date: 03/16/2018   ID: Angela Scott, DOB June 20, 1951, MRN [id number]  PCP: Patient, No Pcp Per  Primary Cardiologist: Nona DellSamuel McDowell, MD   Chief Complaint  Patient presents with  . Cardiac follow-up    History of Present Illness: Angela Scott is a 67 y.o. female seen in consultation back in early June for evaluation of palpitations.  She presents for a follow-up visit.  Overall no major progression in symptoms, states that she is tolerating her palpitations well.  No exertional chest pain or functional limitation.  She is still running for exercise, trying to lose some more weight.  Recent echocardiogram revealed LVEF 55 to 60% without regional wall motion abnormalities, also normal right ventricular contraction.  Cardiac monitor showed sinus rhythm with relatively frequent monomorphic PVCs although representing only 3% of total beats.  There were runs of ventricular bigeminy but no sustained arrhythmias.  We went over the results in detail today.  Past Medical History:  Diagnosis Date  . Eustachian tube dysfunction   . GERD (gastroesophageal reflux disease)   . History of Graves' disease   . Hypothyroidism     Past Surgical History:  Procedure Laterality Date  . KNEE SURGERY Left   . LAPAROSCOPIC GASTRIC BANDING    . LAPAROSCOPIC GASTRIC BANDING WITH HIATAL HERNIA REPAIR    . MANDIBLE SURGERY    . MASS EXCISION Left 01/01/2015   Procedure: EXCISION MASS LEFT 1ST WEB SPACE;  Surgeon: Cindee SaltGary Kuzma, MD;  Location: [facility];  Service: Orthopedics;  Laterality: Left;    Current Outpatient Medications  Medication Sig Dispense Refill  . CYTOMEL 25 MCG tablet Take 1 tablet (25 mcg total) by mouth daily. 90 tablet 3  . ibuprofen (ADVIL,MOTRIN) 200 MG tablet Take 200 mg by mouth every 6 (six) hours as needed (takes 800 mg).    . SYNTHROID 200 MCG tablet TAKE 1 TABLET DAILY 30 each 12  . TOBRAMYCIN OP Apply to eye.     No current  facility-administered medications for this visit.    Allergies:  Clindamycin/lincomycin and Methotrexate derivatives   Social History: The patient  reports that she has been smoking cigarettes.  She has been smoking about 1.00 pack per day. She has never used smokeless tobacco. She reports that she drinks alcohol. She reports that she does not use drugs.   ROS:  Please see the history of present illness. Otherwise, complete review of systems is positive for none.  All other systems are reviewed and negative.   Physical Exam: VS:  BP 128/80   Pulse 81   Ht 5\' 5"  (1.651 m)   Wt 206 lb (93.4 kg)   SpO2 98%   BMI 34.28 kg/m , BMI Body mass index is 34.28 kg/m.  Wt Readings from Last 3 Encounters:  03/16/18 206 lb (93.4 kg)  02/09/18 205 lb (93 kg)  07/19/17 195 lb (88.5 kg)    ECG: I personally reviewed the tracing from 02/09/2018 which showed sinus rhythm with ventricular bigeminy and incomplete right bundle branch block.  Other Studies Reviewed Today:  Echocardiogram 02/19/2018: Study Conclusions  - Left ventricle: The cavity size was normal. Wall thickness was   normal. Systolic function was normal. The estimated ejection   fraction was in the range of 55% to 60%. Diastolic function is   abnormal, indeterminant grade. Wall motion was normal; there were   no regional wall motion abnormalities. - Aortic valve: Valve area (VTI): 2.37 cm^2. Valve  area (Vmax):   2.09 cm^2. Valve area (Vmean): 2.1 cm^2. - Technically adequate study.  Assessment and Plan:  1.  Frequent PVCs, representing 3% of total beats per recent cardiac monitoring.  There were no sustained arrhythmias, no NSVT.  PVCs are monomorphic.  LVEF and RVEF are normal range.  Plan is to continue observation and basic diet with exercise plan.  Could potentially consider beta-blocker for suppression but at this point she voiced comfort with holding off.  Is not report any exertional symptoms or limitations to suspect ischemic  etiology, but this can be further considered depending on how she does.  2.  Intermittent bradycardia, still suspect this is related to ventricular bigeminy with ineffective transmission of PVCs.  She did not have any substantial bradycardia or pauses on cardiac monitor.  Current medicines were reviewed with the patient today.  Disposition: Follow-up in 3 months.  Signed, Jonelle Sidle, MD, [facility] 03/16/2018 4:00 PM    [facility] at [facility] [address] [place], [place], [place] [address] Phone: [phone]; Fax: [phone]

## 2018-03-16 ENCOUNTER — Encounter: Payer: Self-pay | Admitting: Cardiology

## 2018-03-16 ENCOUNTER — Ambulatory Visit (INDEPENDENT_AMBULATORY_CARE_PROVIDER_SITE_OTHER): Payer: Medicare Other | Admitting: Cardiology

## 2018-03-16 VITALS — BP 128/80 | HR 81 | Ht 65.0 in | Wt 206.0 lb

## 2018-03-16 DIAGNOSIS — R001 Bradycardia, unspecified: Secondary | ICD-10-CM

## 2018-03-16 DIAGNOSIS — I493 Ventricular premature depolarization: Secondary | ICD-10-CM | POA: Diagnosis not present

## 2018-03-16 NOTE — Patient Instructions (Signed)
Medication Instructions:  Your physician recommends that you continue on your current medications as directed. Please refer to the Current Medication list given to you today.  Labwork: NONE  Testing/Procedures: NONE  Follow-Up: Your physician recommends that you schedule a follow-up appointment in: 3 MONTHS WITH DR. MCDOWELL IN [place]   Any Other Special Instructions Will Be Listed Below (If Applicable).  If you need a refill on your cardiac medications before your next appointment, please call your pharmacy.

## 2018-06-29 ENCOUNTER — Ambulatory Visit: Payer: Medicare Other | Admitting: Cardiology

## 2018-07-26 ENCOUNTER — Encounter: Payer: Self-pay | Admitting: Cardiology

## 2018-07-26 ENCOUNTER — Ambulatory Visit (INDEPENDENT_AMBULATORY_CARE_PROVIDER_SITE_OTHER): Payer: Medicare Other | Admitting: Cardiology

## 2018-07-26 VITALS — BP 118/68 | HR 60 | Ht 66.0 in | Wt 211.0 lb

## 2018-07-26 DIAGNOSIS — I493 Ventricular premature depolarization: Secondary | ICD-10-CM

## 2018-07-26 NOTE — Patient Instructions (Signed)
Medication Instructions:  Your physician recommends that you continue on your current medications as directed. Please refer to the Current Medication list given to you today.  If you need a refill on your cardiac medications before your next appointment, please call your pharmacy.   Lab work: nONE If you have labs (blood work) drawn today and your tests are completely normal, you will receive your results only by: Marland Kitchen. MyChart Message (if you have MyChart) OR . A paper copy in the mail If you have any lab test that is abnormal or we need to change your treatment, we will call you to review the results.  Testing/Procedures: nONE  Follow-Up: At [facility], you and your health needs are our priority.  As part of our continuing mission to provide you with exceptional heart care, we have created designated Provider Care Teams.  These Care Teams include your primary Cardiologist (physician) and Advanced Practice Providers (APPs -  Physician Assistants and Nurse Practitioners) who all work together to provide you with the care you need, when you need it. You will need a follow up appointment in 1 years.  Please call our office 2 months in advance to schedule this appointment.  You may see Nona DellSamuel McDowell, MD or one of the following Advanced Practice Providers on your designated Care Team:   Randall AnBrittany Strader, PA-C [facility]([place] [facility]) . Jacolyn ReedyMichele Lenze, PA-C [facility]([place] Office)  Any Other Special Instructions Will Be Listed Below (If Applicable). nONE

## 2018-07-26 NOTE — Progress Notes (Signed)
Cardiology Office Note  Date: 07/26/2018   ID: Angela Scott, DOB 03-Jun-1951, MRN [id number]  PCP: Patient, No Pcp Per  Primary Cardiologist: Nona Dell, MD   Chief Complaint  Patient presents with  . PVCs    History of Present Illness: Angela Scott is a 67 y.o. female last seen in July.  She presents for a routine visit.  She does not report any dizziness or syncope, no exertional chest pain.  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  She has not been bothered as much by palpitations since last encounter.  She has relatively frequent PVCs, outflow tract origin, but only 3% of total beats by cardiac monitoring and in the setting of normal LV and RV function. We have not placed her on beta-blocker or antiarrhythmic therapy.  Resting heart rate is generally in the 60s.  She has been walking on the treadmill for 30 minutes at a time.  Ultimately looking forward to retirement.  Past Medical History:  Diagnosis Date  . Eustachian tube dysfunction   . GERD (gastroesophageal reflux disease)   . History of Graves' disease   . Hypothyroidism     Past Surgical History:  Procedure Laterality Date  . KNEE SURGERY Left   . LAPAROSCOPIC GASTRIC BANDING    . LAPAROSCOPIC GASTRIC BANDING WITH HIATAL HERNIA REPAIR    . MANDIBLE SURGERY    . MASS EXCISION Left 01/01/2015   Procedure: EXCISION MASS LEFT 1ST WEB SPACE;  Surgeon: Cindee Salt, MD;  Location: [facility];  Service: Orthopedics;  Laterality: Left;    Current Outpatient Medications  Medication Sig Dispense Refill  . CYTOMEL 25 MCG tablet Take 1 tablet (25 mcg total) by mouth daily. 90 tablet 3  . ibuprofen (ADVIL,MOTRIN) 200 MG tablet Take 200 mg by mouth every 6 (six) hours as needed (takes 800 mg).    . SYNTHROID 200 MCG tablet TAKE 1 TABLET DAILY 30 each 12  . TOBRAMYCIN OP Apply to eye.     No current facility-administered medications for this visit.     Allergies:  Clindamycin/lincomycin and Methotrexate derivatives   Social History: The patient  reports that she has been smoking cigarettes. She has been smoking about 1.00 pack per day. She has never used smokeless tobacco. She reports that she drinks alcohol. She reports that she does not use drugs.   ROS:  Please see the history of present illness. Otherwise, complete review of systems is positive for left shoulder pain and limited range of motion, possibly rotator cuff based on description..  All other systems are reviewed and negative.   Physical Exam: VS:  BP 118/68 (BP Location: Right Arm)   Pulse 60   Ht 5\' 6"  (1.676 m)   Wt 211 lb (95.7 kg)   SpO2 98%   BMI 34.06 kg/m , BMI Body mass index is 34.06 kg/m.  Wt Readings from Last 3 Encounters:  07/26/18 211 lb (95.7 kg)  03/16/18 206 lb (93.4 kg)  02/09/18 205 lb (93 kg)    General: Patient appears comfortable at rest. HEENT: Conjunctiva and lids normal, oropharynx clear. Neck: Supple, no elevated JVP or carotid bruits, no thyromegaly. Lungs: Clear to auscultation, nonlabored breathing at rest. Cardiac: Regular rate and rhythm, no S3 or significant systolic murmur. Abdomen: Soft, nontender, bowel sounds present. Extremities: No pitting edema, distal pulses 2+.  ECG: I personally reviewed the tracing from 02/09/2018 which showed sinus rhythm with  ventricular bigeminy and incomplete right bundle branch block.  Other Studies Reviewed Today:  Echocardiogram 02/19/2018: Study Conclusions  - Left ventricle: The cavity size was normal. Wall thickness was normal. Systolic function was normal. The estimated ejection fraction was in the range of 55% to 60%. Diastolic function is abnormal, indeterminant grade. Wall motion was normal; there were no regional wall motion abnormalities. - Aortic valve: Valve area (VTI): 2.37 cm^2. Valve area (Vmax): 2.09 cm^2. Valve area (Vmean): 2.1 cm^2. - Technically adequate  study.  Assessment and Plan:  Frequent PVCs, outflow tract origin.  She is actually less symptomatic at this time, has not had any chest pain or syncope.  PVCs represented 3% of total beats by cardiac monitoring and LV as well as RV function are normal.  We will continue with observation at this point.  We discussed possibility of other treatment options including beta-blocker, antiarrhythmics, and EP referral if symptoms escalate.  Current medicines were reviewed with the patient today.  Disposition: Follow-up in 1 year, sooner if needed.  Signed, Jonelle SidleSamuel G. [facility] Stfleur, MD, [facility] 07/26/2018 4:00 PM     [facility] at [facility] [address], [place], [place] [address] Phone: [phone]; Fax: [phone]

## 2018-09-03 ENCOUNTER — Other Ambulatory Visit (HOSPITAL_COMMUNITY): Payer: Self-pay | Admitting: Surgery

## 2018-09-03 ENCOUNTER — Ambulatory Visit (HOSPITAL_COMMUNITY)
Admission: RE | Admit: 2018-09-03 | Discharge: 2018-09-03 | Disposition: A | Discharge status: Home / Self Care | Payer: Medicare Other | Source: Ambulatory Visit | Attending: Surgery | Admitting: Surgery

## 2018-09-03 ENCOUNTER — Other Ambulatory Visit: Payer: Self-pay | Admitting: Surgery

## 2018-09-03 DIAGNOSIS — M25612 Stiffness of left shoulder, not elsewhere classified: Secondary | ICD-10-CM | POA: Insufficient documentation

## 2018-09-28 ENCOUNTER — Ambulatory Visit: Payer: Medicare Other | Attending: Orthopaedic Surgery | Admitting: Physical Therapy

## 2018-09-28 ENCOUNTER — Encounter: Payer: Self-pay | Admitting: Physical Therapy

## 2018-09-28 ENCOUNTER — Other Ambulatory Visit: Payer: Self-pay

## 2018-09-28 DIAGNOSIS — M25612 Stiffness of left shoulder, not elsewhere classified: Secondary | ICD-10-CM | POA: Insufficient documentation

## 2018-09-28 DIAGNOSIS — R29898 Other symptoms and signs involving the musculoskeletal system: Secondary | ICD-10-CM | POA: Insufficient documentation

## 2018-09-28 DIAGNOSIS — M25512 Pain in left shoulder: Secondary | ICD-10-CM | POA: Diagnosis present

## 2018-09-28 DIAGNOSIS — R293 Abnormal posture: Secondary | ICD-10-CM | POA: Insufficient documentation

## 2018-09-28 DIAGNOSIS — G8929 Other chronic pain: Secondary | ICD-10-CM | POA: Insufficient documentation

## 2018-09-28 NOTE — Therapy (Signed)
[facility] [address]  [address] [place], [place], [address] Phone: [phone]   Fax:  [phone]  Physical Therapy Evaluation  Patient Details  Name: Angela Scott MRN: [id number] Date of Birth: 01/01/1951 Referring Provider (PT): Celso Sickle, MD   Encounter Date: 09/28/2018  PT End of Session - 09/28/18 0854    Visit Number  1    Number of Visits  7    Date for PT Re-Evaluation  11/09/18    Authorization Type  Med[employer]    PT Start Time  [phone]    PT Stop Time  0847    PT Time Calculation (min)  36 min    Activity Tolerance  Patient tolerated treatment well;Patient limited by pain    Behavior During Therapy  [facility] for tasks assessed/performed       Past Medical History:  Diagnosis Date  . Eustachian tube dysfunction   . GERD (gastroesophageal reflux disease)   . History of Graves' disease   . Hypothyroidism     Past Surgical History:  Procedure Laterality Date  . KNEE SURGERY Left   . LAPAROSCOPIC GASTRIC BANDING    . LAPAROSCOPIC GASTRIC BANDING WITH HIATAL HERNIA REPAIR    . MANDIBLE SURGERY    . MASS EXCISION Left 01/01/2015   Procedure: EXCISION MASS LEFT 1ST WEB SPACE;  Surgeon: Cindee Salt, MD;  Location: [facility];  Service: Orthopedics;  Laterality: Left;    There were no vitals filed for this visit.   Subjective Assessment - 09/28/18 0813    Subjective  Patient reports onset of L shoulder pain in July 2019. Reports that she was at a medical facility and a BP cuff was placed on her arm and it would not shut off, tightly squeezing her arm. Reports that ever since this incident, she has had limited ROM in the L shoulder. Has trouble putting her arm into a sleeve, placing her arm behind her back, picking things up, reaching overhead. Better with Motrin. Since onset, has been trying to stretch into pain and feels it has been slightly better. L shoulder pain is located over  lateral superior shoulder radiating down arm to elbow. Reports that ROM limits her more than pain.    Pertinent History  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, mandible surgery, L knee surgery     Limitations  Lifting;House hold activities    Diagnostic tests  09/04/18 L shoulder MRI: small partial-thickness bursal surface tear of supraspinatus, tendinosis of infraspinatus, biceps tendon, superior labral tear, subdeltoid bursitis     Patient Stated Goals  "full ROM of my left arm"    Currently in Pain?  Yes    Pain Score  2     Pain Location  Arm    Pain Orientation  Left;Lower    Pain Descriptors / Indicators  Stabbing    Pain Type  Chronic pain         [facility] PT Assessment - 09/28/18 0820      Assessment   Medical Diagnosis  L shoulder adhesive capsulitis    Referring Provider (PT)  Celso Sickle, MD    Onset Date/Surgical Date  --   July 2020   Hand Dominance  Left    Next MD Visit  --   not scheduled   Prior Therapy  no      Precautions   Precautions  None  Restrictions   Weight Bearing Restrictions  No      Balance Screen   Has the patient fallen in the past 6 months  No    Has the patient had a decrease in activity level because of a fear of falling?   No    Is the patient reluctant to leave their home because of a fear of falling?   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    Leisure  none      Cognition   Overall Cognitive Status  Within Functional Limits for tasks assessed      Observation/Other Assessments   Focus on Therapeutic Outcomes (FOTO)   next session      Sensation   Light Touch  Appears Intact      Coordination   Gross Motor Movements are Fluid and Coordinated  Yes      Posture/Postural Control   Posture/Postural Control  Postural limitations     Postural Limitations  Rounded Shoulders;Forward head      ROM / Strength   AROM / PROM / Strength  AROM;PROM;Strength      AROM   AROM Assessment Site  Shoulder    Right/Left Shoulder  Right;Left    Right Shoulder Flexion  160 Degrees    Right Shoulder ABduction  168 Degrees    Right Shoulder Internal Rotation  --   FIR T7   Right Shoulder External Rotation  --   FER T2   Left Shoulder Flexion  126 Degrees    Left Shoulder ABduction  106 Degrees    Left Shoulder Internal Rotation  --   FIR T 10   Left Shoulder External Rotation  --   FER T 2 but with considerable compensation     PROM   PROM Assessment Site  Shoulder    Right/Left Shoulder  Right;Left    Left Shoulder Flexion  112 Degrees   pain   Left Shoulder ABduction  85 Degrees   pain   Left Shoulder Internal Rotation  45 Degrees   pain   Left Shoulder External Rotation  34 Degrees   pain     Strength   Strength Assessment Site  Shoulder    Right/Left Shoulder  Right;Left    Right Shoulder Flexion  4/5    Right Shoulder ABduction  4/5    Right Shoulder Internal Rotation  4+/5    Right Shoulder External Rotation  4+/5    Left Shoulder Flexion  4/5   pain    Left Shoulder ABduction  4/5   pain   Left Shoulder Internal Rotation  4+/5   pain   Left Shoulder External Rotation  --   pain     Palpation   Palpation comment  TTP in areas of L supraspinatus and infraspinatus                 Objective measurements completed on examination: See above findings.              PT Education - 09/28/18 0853    Education Details  prognosis, POC, HEP    Person(s) Educated  Patient    Methods  Explanation;Demonstration;Tactile cues;Verbal cues;Handout    Comprehension  Verbalized understanding;Returned demonstration       PT Short Term Goals - 09/28/18 2761  PT SHORT TERM GOAL #1   Title  Patient to be independent with initial HEP.    Time  3    Period  Weeks    Status  New    Target Date   10/19/18        PT Long Term Goals - 09/28/18 0904      PT LONG TERM GOAL #1   Title  Patient to be independent with advanced HEP.    Time  6    Period  Weeks    Status  New    Target Date  11/09/18      PT LONG TERM GOAL #2   Title  Patient to demonstrate L shoulder AROM/PROM without pain limiting and WFL.     Time  6    Period  Weeks    Status  New    Target Date  11/09/18      PT LONG TERM GOAL #3   Title  Patient to demonstrate L shoulder strength >=4+/5.    Time  6    Period  Weeks    Status  New    Target Date  11/09/18      PT LONG TERM GOAL #4   Title  Patient to report 1 full day at work without use of shoulder compensations and without pain limiting.     Time  6    Period  Weeks    Status  New    Target Date  11/09/18             Plan - 09/28/18 0902    Clinical Impression Statement  Patient is a 67y/o F presenting to OPPT with c/o L shoulder pain of 6 months duration. Recent MRI revealed small partial-thickness bursal surface tear of supraspinatus and tendinosis of infraspinatus and biceps tendon; also with diagnosis of adhesive capsulitis. Pain is located over superolateral shoulder radiating down lateral arm to elbow. Notes difficulty with putting her arm into a sleeve, placing her arm behind her back, picking things up, reaching overhead. Patient today with limited and painful L shoulder AROM/PROM, decreased shoulder strength, TPP in L supraspinatus and infraspinatus regions, and shoulder shoulders posturing. Would benefit from skilled PT services 1x/week for 6 weeks to address aforementioned impairments.     Clinical Presentation  Stable    Clinical Decision Making  Low    Rehab Potential  Good    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, mandible surgery, L knee surgery     PT Frequency  1x / week    PT Duration  6 weeks    PT  Treatment/Interventions  ADLs/Self Care Home Management;Cryotherapy;Electrical Stimulation;Iontophoresis 4mg /ml Dexamethasone;Functional mobility training;Ultrasound;Moist Heat;Therapeutic activities;Therapeutic exercise;Neuromuscular re-education;Patient/family education;Passive range of motion;Manual techniques;Energy conservation;Dry needling;Splinting;Taping;Vasopneumatic Device    PT Next Visit Plan  reassess HEP; FOTO    Consulted and Agree with Plan of Care  Patient       Patient will benefit from skilled therapeutic intervention in order to improve the following deficits and impairments:  Decreased range of motion, Increased fascial restricitons, Impaired UE functional use, Increased muscle spasms, Decreased activity tolerance, Pain, Improper body mechanics, Impaired flexibility, Hypomobility, Decreased strength, Postural dysfunction  Visit Diagnosis: Stiffness of left shoulder, not elsewhere classified  Chronic left shoulder pain  Other symptoms and signs involving the musculoskeletal system  Abnormal posture     Problem List Patient Active Problem List   Diagnosis Date  Noted  . 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 Hypothyroidism 02/05/2010  . GERD 02/05/2010  . FURUNCLE 02/05/2010  . SYSTEMIC LUPUS ERYTHEMATOSUS 02/05/2010    Anette Guarneri, PT, DPT 09/28/18 9:08 AM   [facility] [address]  [address] [place], [place], [address] Phone: [phone]   Fax:  [phone]  Name: Angela Scott MRN: [id number] Date of Birth: 12-May-1951

## 2018-10-05 ENCOUNTER — Ambulatory Visit: Payer: Medicare Other | Admitting: Physical Therapy

## 2018-10-05 ENCOUNTER — Encounter: Payer: Self-pay | Admitting: Physical Therapy

## 2018-10-05 DIAGNOSIS — R29898 Other symptoms and signs involving the musculoskeletal system: Secondary | ICD-10-CM

## 2018-10-05 DIAGNOSIS — R293 Abnormal posture: Secondary | ICD-10-CM

## 2018-10-05 DIAGNOSIS — M25512 Pain in left shoulder: Secondary | ICD-10-CM

## 2018-10-05 DIAGNOSIS — M25612 Stiffness of left shoulder, not elsewhere classified: Secondary | ICD-10-CM | POA: Diagnosis not present

## 2018-10-05 DIAGNOSIS — G8929 Other chronic pain: Secondary | ICD-10-CM

## 2018-10-05 NOTE — Therapy (Signed)
[facility] [address]  [address] [place], [place], [address] Phone: [phone]   Fax:  [phone]  Physical Therapy Treatment  Patient Details  Name: Angela Scott MRN: [id number] Date of Birth: 08/20/1951 Referring Provider (PT): Celso SickleBradley Broussard, MD   Encounter Date: 10/05/2018  PT End of Session - 10/05/18 0842    Visit Number  2    Number of Visits  7    Date for PT Re-Evaluation  11/09/18    Authorization Type  [employer]    PT Start Time  [phone]    PT Stop Time  [phone]    PT Time Calculation (min)  45 min    Activity Tolerance  Patient tolerated treatment well    Behavior During Therapy  [facility] for tasks assessed/performed       Past Medical History:  Diagnosis Date  . Eustachian tube dysfunction   . GERD (gastroesophageal reflux disease)   . History of Graves' disease   . Hypothyroidism     Past Surgical History:  Procedure Laterality Date  . KNEE SURGERY Left   . LAPAROSCOPIC GASTRIC BANDING    . LAPAROSCOPIC GASTRIC BANDING WITH HIATAL HERNIA REPAIR    . MANDIBLE SURGERY    . MASS EXCISION Left 01/01/2015   Procedure: EXCISION MASS LEFT 1ST WEB SPACE;  Surgeon: Cindee SaltGary Kuzma, MD;  Location: [facility];  Service: Orthopedics;  Laterality: Left;    There were no vitals filed for this visit.  Subjective Assessment - 10/05/18 0801    Subjective  Reports that she is doing better- people are noticing that her arm is moving better. Has some questions about the HEP.    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  Yes     Pain Score  4     Pain Orientation  Left    Pain Descriptors / Indicators  Stabbing    Pain Type  Chronic pain                       [facility] Adult PT Treatment/Exercise - 10/05/18 0001      Exercises   Exercises  Shoulder      Shoulder Exercises: Supine   External Rotation  Strengthening;Left;10 reps    External Rotation Limitations  with dowel to tolerance; at neutral    Flexion  AAROM;Left;10 reps;Limitations    Flexion Limitations  with dowel to tolerance    ABduction  AAROM;Left;10 reps;Limitations    ABduction Limitations  with dowel to tolerance   reported nonpainful popping in L shoulder     Shoulder Exercises: Seated   Other Seated Exercises  scapular retraction 10x3"    cues to avoid shoulder hiking     Shoulder Exercises: Sidelying   External Rotation  Strengthening;Left;10 reps;Weights    External Rotation Limitations  2x10; 2nd set with 1#; dowel under elbow for neutral positioning      Shoulder Exercises: Pulleys   Flexion  3 minutes    Flexion Limitations  advised to avoid pain    Scaption  3 minutes  Scaption Limitations  advised to avoid pain      Shoulder Exercises: Stretch   Internal Rotation Stretch  5 reps    Internal Rotation Stretch Limitations  L IR/ER stretch with strap 5x5" each   better tolerance for ER     Manual Therapy   Manual Therapy  Joint mobilization;Soft tissue mobilization;Passive ROM    Manual therapy comments  supine    Joint Mobilization  L shoulder grade III posterior, anterior, and inferior mobs to tolerance    Soft tissue mobilization  STM to L infraspoinatus/teres group, pec, biceps- TTP in infra group    Passive ROM  L shoulder PROM in all planes to tolerance             PT Education - 10/05/18 0842    Education Details  update to HEP    Person(s) Educated  Patient    Methods  Explanation;Demonstration;Tactile cues;Verbal cues;Handout    Comprehension  Verbalized understanding;Returned  demonstration       PT Short Term Goals - 10/05/18 0851      PT SHORT TERM GOAL #1   Title  Patient to be independent with initial HEP.    Time  3    Period  Weeks    Status  On-going        PT Long Term Goals - 10/05/18 8841      PT LONG TERM GOAL #1   Title  Patient to be independent with advanced HEP.    Time  6    Period  Weeks    Status  On-going      PT LONG TERM GOAL #2   Title  Patient to demonstrate L shoulder AROM/PROM without pain limiting and WFL.     Time  6    Period  Weeks    Status  On-going      PT LONG TERM GOAL #3   Title  Patient to demonstrate L shoulder strength >=4+/5.    Time  6    Period  Weeks    Status  On-going      PT LONG TERM GOAL #4   Title  Patient to report 1 full day at work without use of shoulder compensations and without pain limiting.     Time  6    Period  Weeks    Status  On-going            Plan - 10/05/18 6606    Clinical Impression Statement  Patient arrived to session with report of compliance with HEP and report of improvement in ROM. Tolerated STM, PROM, and joint mobs to L shoulder- patient most limited in abduction and ER motions. Had some pain with inferior mobilizations, which were discontinued. Patient with c/o pain in L infraspinatus/teres group which responded well to STM. Reviewed AAROM with corrections given for form. Introduced sidelying ER and abduction with patient reporting no pain. Introduced ER and IR stretching with cues to avoid pushing into pain. Updated HEP to include exercises that were well tolerated today. Patient without complaints at end of session.     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, mandible surgery, L knee surgery     PT Treatment/Interventions  ADLs/Self Care Home Management;Cryotherapy;Electrical Stimulation;Iontophoresis 4mg /ml Dexamethasone;Functional mobility  training;Ultrasound;Moist Heat;Therapeutic activities;Therapeutic exercise;Neuromuscular re-education;Patient/family education;Passive range of motion;Manual techniques;Energy conservation;Dry needling;Splinting;Taping;Vasopneumatic Device    PT Next Visit Plan  progress shoulder strengthening and ROM; FOTO  Consulted and Agree with Plan of Care  Patient       Patient will benefit from skilled therapeutic intervention in order to improve the following deficits and impairments:  Decreased range of motion, Increased fascial restricitons, Impaired UE functional use, Increased muscle spasms, Decreased activity tolerance, Pain, Improper body mechanics, Impaired flexibility, Hypomobility, Decreased strength, Postural dysfunction  Visit Diagnosis: Stiffness of left shoulder, not elsewhere classified  Chronic left shoulder pain  Other symptoms and signs involving the musculoskeletal system  Abnormal posture     Problem List Patient Active Problem List   Diagnosis Date Noted  . 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 Hypothyroidism 02/05/2010  . GERD 02/05/2010  . FURUNCLE 02/05/2010  . SYSTEMIC LUPUS ERYTHEMATOSUS 02/05/2010     Anette Guarneri, PT, DPT 10/05/18 8:57 AM   [facility] [address]  [address] [place], [place], [address] Phone: [phone]   Fax:  [phone]  Name: Angela Scott MRN: [id number] Date of Birth: Feb 24, 1951

## 2018-10-12 ENCOUNTER — Ambulatory Visit: Payer: Medicare Other | Attending: Orthopaedic Surgery

## 2018-10-12 DIAGNOSIS — M25512 Pain in left shoulder: Secondary | ICD-10-CM | POA: Insufficient documentation

## 2018-10-12 DIAGNOSIS — R29898 Other symptoms and signs involving the musculoskeletal system: Secondary | ICD-10-CM | POA: Diagnosis present

## 2018-10-12 DIAGNOSIS — M25612 Stiffness of left shoulder, not elsewhere classified: Secondary | ICD-10-CM | POA: Insufficient documentation

## 2018-10-12 DIAGNOSIS — R293 Abnormal posture: Secondary | ICD-10-CM | POA: Insufficient documentation

## 2018-10-12 DIAGNOSIS — G8929 Other chronic pain: Secondary | ICD-10-CM

## 2018-10-12 NOTE — Therapy (Signed)
[facility] [address]  [address] [place], [place], [address] Phone: [phone]   Fax:  [phone]  Physical Therapy Treatment  Patient Details  Name: Angela Scott MRN: [id number] Date of Birth: [birth year] Referring Provider (PT): Celso SickleBradley Broussard, MD   Encounter Date: 10/12/2018  PT End of Session - 10/12/18 0806    Visit Number  3    Number of Visits  7    Date for PT Re-Evaluation  11/09/18    Authorization Type  [employer]    PT Start Time  0800    PT Stop Time  0859    PT Time Calculation (min)  59 min    Activity Tolerance  Patient tolerated treatment well    Behavior During Therapy  [facility] for tasks assessed/performed       Past Medical History:  Diagnosis Date  . Eustachian tube dysfunction   . GERD (gastroesophageal reflux disease)   . History of Graves' disease   . Hypothyroidism     Past Surgical History:  Procedure Laterality Date  . KNEE SURGERY Left   . LAPAROSCOPIC GASTRIC BANDING    . LAPAROSCOPIC GASTRIC BANDING WITH HIATAL HERNIA REPAIR    . MANDIBLE SURGERY    . MASS EXCISION Left 01/01/2015   Procedure: EXCISION MASS LEFT 1ST WEB SPACE;  Surgeon: Cindee SaltGary Kuzma, MD;  Location: [facility];  Service: Orthopedics;  Laterality: Left;    There were no vitals filed for this visit.  Subjective Assessment - 10/12/18 0805    Subjective  Pt. reporting there is one exercise she is unclear on with updated HEP.      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  Yes    Pain Score  5     Pain Location  Elbow     Pain Orientation  Left    Pain Descriptors / Indicators  --   "pulling"    Pain Type  Chronic pain    Pain Onset  More than a month ago    Pain Frequency  Intermittent    Aggravating Factors   with most motions    Multiple Pain Sites  No                       [facility] Adult PT Treatment/Exercise - 10/12/18 [id number]      Shoulder Exercises: Seated   External Rotation  Both;10 reps;Theraband;Strengthening    Theraband Level (Shoulder External Rotation)  Level 1 (Yellow)    External Rotation Limitations  Cues for scapular retraction     Abduction  Left;10 reps;AAROM    ABduction Limitations  scaption table slide       Shoulder Exercises: Pulleys   Flexion  3 minutes    Flexion Limitations  advised to avoid pain    Scaption  3 minutes    Scaption Limitations  advised to avoid pain      Shoulder Exercises: Stretch   Corner Stretch  1 rep;30 seconds    Corner Stretch Limitations  corner stretch mid position     Internal Rotation  Stretch  10 seconds   x 5 reps; 2 sets; 2nd set with cane    Internal Rotation Stretch Limitations  cane     External Rotation Stretch  5 reps;10 seconds   with strap    Other Shoulder Stretches  L pec stretch 2 x 20 sec on doorframe       Modalities   Modalities  Vasopneumatic      Vasopneumatic   Number Minutes Vasopneumatic   10 minutes    Vasopnuematic Location   Shoulder   L   Vasopneumatic Pressure  Low    Vasopneumatic Temperature   coldest temp.       Manual Therapy   Manual Therapy  Passive ROM;Soft tissue mobilization    Manual therapy comments  supine    Soft tissue mobilization  STM to L infraspoinatus/teres, lateral deltoid     Passive ROM  L shoulder PROM in all planes to tolerance             PT Education - 10/12/18 0858    Education Details  Updated HEP     Person(s) Educated  Patient    Methods  Explanation;Demonstration;Verbal cues;Handout    Comprehension  Verbalized understanding;Returned  demonstration;Verbal cues required;Need further instruction       PT Short Term Goals - 10/12/18 7482      PT SHORT TERM GOAL #1   Title  Patient to be independent with initial HEP.    Time  3    Period  Weeks    Status  Achieved        PT Long Term Goals - 10/05/18 7078      PT LONG TERM GOAL #1   Title  Patient to be independent with advanced HEP.    Time  6    Period  Weeks    Status  On-going      PT LONG TERM GOAL #2   Title  Patient to demonstrate L shoulder AROM/PROM without pain limiting and WFL.     Time  6    Period  Weeks    Status  On-going      PT LONG TERM GOAL #3   Title  Patient to demonstrate L shoulder strength >=4+/5.    Time  6    Period  Weeks    Status  On-going      PT LONG TERM GOAL #4   Title  Patient to report 1 full day at work without use of shoulder compensations and without pain limiting.     Time  6    Period  Weeks    Status  On-going            Plan - 10/12/18 6754    Clinical Impression Statement  Pt. reporting some confusion with shoulder IR HEP stretch.  Reviewed this activity today and provided pt. alternatve via handout for HEP with pt. verbalizing understanding.  Session focused on gentle ROM and scapular strengthening activitie.  Tolerated initiation of gentle RTC strengthening today well.  Ended visit with ice/compression to L shoulder to reduce post-exercise swelling and discomfort.  Able to achieve STG#1.       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, mandible surgery, L knee surgery     PT Treatment/Interventions  ADLs/Self Care Home Management;Cryotherapy;Electrical Stimulation;Iontophoresis 4mg /ml Dexamethasone;Functional mobility training;Ultrasound;Moist Heat;Therapeutic activities;Therapeutic exercise;Neuromuscular re-education;Patient/family education;Passive range of motion;Manual techniques;Energy  conservation;Dry needling;Splinting;Taping;Vasopneumatic Device  PT Next Visit Plan  progress shoulder strengthening and ROM; FOTO    Consulted and Agree with Plan of Care  Patient       Patient will benefit from skilled therapeutic intervention in order to improve the following deficits and impairments:  Decreased range of motion, Increased fascial restricitons, Impaired UE functional use, Increased muscle spasms, Decreased activity tolerance, Pain, Improper body mechanics, Impaired flexibility, Hypomobility, Decreased strength, Postural dysfunction  Visit Diagnosis: Stiffness of left shoulder, not elsewhere classified  Chronic left shoulder pain  Other symptoms and signs involving the musculoskeletal system  Abnormal posture     Problem List Patient Active Problem List   Diagnosis Date Noted  . 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 Hypothyroidism 02/05/2010  . GERD 02/05/2010  . FURUNCLE 02/05/2010  . SYSTEMIC LUPUS ERYTHEMATOSUS 02/05/2010    Kermit BaloMicah Kyser Wandel, PTA 10/12/18 9:16 AM  [facility] [place] [address]  [address] [place], [place], [address] Phone: [phone]   Fax:  [phone]  Name: Angela Scott MRN: [id number] Date of Birth: Oct 13, 1950

## 2018-10-19 ENCOUNTER — Ambulatory Visit: Payer: Medicare Other

## 2018-10-19 DIAGNOSIS — M25612 Stiffness of left shoulder, not elsewhere classified: Secondary | ICD-10-CM | POA: Diagnosis not present

## 2018-10-19 DIAGNOSIS — R29898 Other symptoms and signs involving the musculoskeletal system: Secondary | ICD-10-CM

## 2018-10-19 DIAGNOSIS — R293 Abnormal posture: Secondary | ICD-10-CM

## 2018-10-19 DIAGNOSIS — M25512 Pain in left shoulder: Secondary | ICD-10-CM

## 2018-10-19 DIAGNOSIS — G8929 Other chronic pain: Secondary | ICD-10-CM

## 2018-10-19 NOTE — Therapy (Signed)
[facility] [address]  [address] [place], [place], [address] Phone: [phone]   Fax:  [phone]  Physical Therapy Treatment  Patient Details  Name: Angela Scott MRN: [id number] Date of Birth: 1951-03-27 Referring Provider (PT): Celso SickleBradley Broussard, MD   Encounter Date: 10/19/2018  PT End of Session - 10/19/18 0806    Visit Number  4    Number of Visits  7    Date for PT Re-Evaluation  11/09/18    Authorization Type  [employer]    PT Start Time  0802    PT Stop Time  0855    PT Time Calculation (min)  53 min    Activity Tolerance  Patient tolerated treatment well    Behavior During Therapy  [facility] for tasks assessed/performed       Past Medical History:  Diagnosis Date  . Eustachian tube dysfunction   . GERD (gastroesophageal reflux disease)   . History of Graves' disease   . Hypothyroidism     Past Surgical History:  Procedure Laterality Date  . KNEE SURGERY Left   . LAPAROSCOPIC GASTRIC BANDING    . LAPAROSCOPIC GASTRIC BANDING WITH HIATAL HERNIA REPAIR    . MANDIBLE SURGERY    . MASS EXCISION Left 01/01/2015   Procedure: EXCISION MASS LEFT 1ST WEB SPACE;  Surgeon: Cindee SaltGary Kuzma, MD;  Location: [facility];  Service: Orthopedics;  Laterality: Left;    There were no vitals filed for this visit.  Subjective Assessment - 10/19/18 0804    Subjective  Pt. reporting she made a quick move to catch her phone from dropping yesterday which irritated L shoulder.      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  No/denies    Pain Score  0-No pain   Pt. reporting L shoulder quick pain up to 9/10 at worst while trying to catch phone   Pain Location  Shoulder    Pain Orientation  Left    Pain Type  Chronic pain    Pain Onset  More than a month ago    Pain Frequency  Intermittent    Multiple Pain Sites  No                       [facility] Adult PT Treatment/Exercise - 10/19/18 0811      Shoulder Exercises: Standing   External Rotation  Left;10 reps;Theraband;Strengthening    Internal Rotation  Left;10 reps;Theraband;Strengthening    Theraband Level (Shoulder Internal Rotation)  Level 1 (Yellow)    Internal Rotation Limitations  Cues for neutral elbow positioning     Extension  15 reps;Both    Theraband Level (Shoulder Extension)  Level 1 (Yellow)    Extension Limitations  Cues for scapular retrraction     Row  15 reps;Both;Theraband;Strengthening    Theraband Level (Shoulder Row)  Level 2 (Red)    Row Limitations  Cues for scapular retraction       Shoulder Exercises: ROM/Strengthening   UBE (Upper Arm Bike)  Lvl 1.0, 3 min forward/3 min backwards  Cybex Row  15 reps    Cybex Row Limitations  10# - low row       Vasopneumatic   Number Minutes Vasopneumatic   15 minutes    Vasopnuematic Location   Shoulder   L             PT Education - 10/19/18 1149    Education Details  HEP update; yellow TB issued to pt.    Person(s) Educated  Patient    Methods  Explanation;Demonstration;Verbal cues;Handout    Comprehension  Verbalized understanding;Returned demonstration;Verbal cues required;Need further instruction       PT Short Term Goals - 10/12/18 7096      PT SHORT TERM GOAL #1   Title  Patient to be independent with initial HEP.    Time  3    Period  Weeks    Status  Achieved        PT Long Term Goals - 10/05/18 2836      PT LONG TERM GOAL #1   Title  Patient to be independent with advanced HEP.    Time  6    Period  Weeks    Status  On-going      PT LONG TERM GOAL  #2   Title  Patient to demonstrate L shoulder AROM/PROM without pain limiting and WFL.     Time  6    Period  Weeks    Status  On-going      PT LONG TERM GOAL #3   Title  Patient to demonstrate L shoulder strength >=4+/5.    Time  6    Period  Weeks    Status  On-going      PT LONG TERM GOAL #4   Title  Patient to report 1 full day at work without use of shoulder compensations and without pain limiting.     Time  6    Period  Weeks    Status  On-going            Plan - 10/19/18 0807    Clinical Impression Statement  Angela Scott noting improved motion reaching behind back at work with less pain since last session.  Session focused on initiation of L RTC/scapular strengthening activities with gentle band resistance.  Pt. tolerated all activities well today in session.  Encouraged to avoid "overdoing" HEP update issued to pt. today including shoulder ER, IR, row exercises with yellow TB issued to pt.  Pt. verbalized understanding.  Ended visit with ice/compression to L shoulder to reduce post-exercise soreness and pain.  Will continue to progress toward goals.      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, mandible surgery, L knee surgery     PT Treatment/Interventions  ADLs/Self Care Home Management;Cryotherapy;Electrical Stimulation;Iontophoresis 4mg /ml Dexamethasone;Functional mobility training;Ultrasound;Moist Heat;Therapeutic activities;Therapeutic exercise;Neuromuscular re-education;Patient/family education;Passive range of motion;Manual techniques;Energy conservation;Dry needling;Splinting;Taping;Vasopneumatic Device    PT Next Visit Plan  progress shoulder strengthening and ROM    Consulted and Agree with Plan of Care  Patient       Patient will benefit from skilled therapeutic intervention in order to improve the following deficits and impairments:  Decreased range of  motion, Increased fascial restricitons, Impaired UE functional use, Increased muscle spasms, Decreased activity tolerance, Pain, Improper body mechanics, Impaired flexibility, Hypomobility, Decreased strength, Postural dysfunction  Visit Diagnosis: Stiffness of left shoulder, not elsewhere classified  Chronic left shoulder pain  Other symptoms and  signs involving the musculoskeletal system  Abnormal posture     Problem List Patient Active Problem List   Diagnosis Date Noted  . 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 Hypothyroidism 02/05/2010  . GERD 02/05/2010  . FURUNCLE 02/05/2010  . SYSTEMIC LUPUS ERYTHEMATOSUS 02/05/2010    Kermit Balo, PTA 10/19/18 12:09 PM  [facility] [address]  [address] [place], [place], [address] Phone: [phone]   Fax:  [phone]  Name: Angela Scott MRN: [id number] Date of Birth: 11-26-1950

## 2018-10-26 ENCOUNTER — Ambulatory Visit: Payer: Medicare Other | Admitting: Physical Therapy

## 2018-10-26 ENCOUNTER — Encounter: Payer: Self-pay | Admitting: Physical Therapy

## 2018-10-26 DIAGNOSIS — M25512 Pain in left shoulder: Secondary | ICD-10-CM

## 2018-10-26 DIAGNOSIS — R293 Abnormal posture: Secondary | ICD-10-CM

## 2018-10-26 DIAGNOSIS — R29898 Other symptoms and signs involving the musculoskeletal system: Secondary | ICD-10-CM

## 2018-10-26 DIAGNOSIS — G8929 Other chronic pain: Secondary | ICD-10-CM

## 2018-10-26 DIAGNOSIS — M25612 Stiffness of left shoulder, not elsewhere classified: Secondary | ICD-10-CM | POA: Diagnosis not present

## 2018-10-26 NOTE — Therapy (Signed)
[facility] [address]  [address] [place], [place], [address] Phone: [phone]   Fax:  [phone]  Physical Therapy Treatment  Patient Details  Name: Angela Scott MRN: [id number] Date of Birth: 06-25-1951 Referring Provider (PT): Celso Sickle, MD   Encounter Date: 10/26/2018  PT End of Session - 10/26/18 0847    Visit Number  5    Number of Visits  7    Date for PT Re-Evaluation  11/09/18    Authorization Type  [employer]    PT Start Time  [phone]    PT Stop Time  [phone]    PT Time Calculation (min)  45 min    Activity Tolerance  Patient tolerated treatment well;Patient limited by pain    Behavior During Therapy  [facility] for tasks assessed/performed       Past Medical History:  Diagnosis Date  . Eustachian tube dysfunction   . GERD (gastroesophageal reflux disease)   . History of Graves' disease   . Hypothyroidism     Past Surgical History:  Procedure Laterality Date  . KNEE SURGERY Left   . LAPAROSCOPIC GASTRIC BANDING    . LAPAROSCOPIC GASTRIC BANDING WITH HIATAL HERNIA REPAIR    . MANDIBLE SURGERY    . MASS EXCISION Left 01/01/2015   Procedure: EXCISION MASS LEFT 1ST WEB SPACE;  Surgeon: Cindee Salt, MD;  Location: [facility];  Service: Orthopedics;  Laterality: Left;    There were no vitals filed for this visit.  Subjective Assessment - 10/26/18 0756    Subjective  Reports that she is now able to use her L arm to steer her car. Still has pain- but it is more of an ache rather than a jabbing pain.     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  Yes    Pain Score  0-No pain    Pain Location  Shoulder    Pain Orientation  Left;Posterior    Pain Descriptors / Indicators  --   fatigue                      [facility] Adult PT Treatment/Exercise - 10/26/18 0001      Shoulder Exercises: Seated   Flexion  Strengthening;Left;10 reps;Weights    Flexion Weight (lbs)  1    Flexion Limitations  cues to avoid shoulder hiking    Abduction  Left;10 reps;Strengthening;Weights    ABduction Weight (lbs)  1    ABduction Limitations  thumb up; slight difficulty at ROM    Other Seated Exercises  L scaption 1# x10   good control     Shoulder Exercises: Standing   External Rotation  Left;10 reps;Theraband;Strengthening    Theraband Level (Shoulder External Rotation)  Level 1 (Yellow);Level 2 (Red)    External Rotation Limitations  10x yellow, 10x red; heavy cues for form    Internal Rotation  Left;10 reps;Theraband;Strengthening    Theraband Level (Shoulder Internal Rotation)  Level 1 (Yellow);Level 2 (Red)    Internal Rotation Limitations  10x yellow, 10x red; heavy correction of cues  Row  15 reps;Both;Theraband;Strengthening    Theraband Level (Shoulder Row)  Level 2 (Red);Level 3 (Green)    Row Limitations  1 set red, 1 set green; cues to stop at neutral      Shoulder Exercises: ROM/Strengthening   UBE (Upper Arm Bike)  Lvl 1.5, 3 min forward/3 min backwards     Lat Pull  15 reps    Lat Pull Limitations  10#; unable to tolerate 10# d/t L shoulder pain      Shoulder Exercises: Stretch   Other Shoulder Stretches  L IR stretch with strap 10x5"   to tolerance; intermittent c/o cramping in L arm and shoulde     Manual Therapy   Manual Therapy  Passive ROM;Soft tissue mobilization    Manual therapy comments  supine    Soft tissue mobilization  STM to L infraspoinatus/teres, lateral deltoid- TTP throughout    Passive ROM  L shoulder PROM in all planes to tolerance- good ROM in flexion and  abduction, most painful in IR             PT Education - 10/26/18 0844    Education Details  administered red and green TB    Person(s) Educated  Patient    Methods  Explanation;Demonstration;Tactile cues;Verbal cues    Comprehension  Verbalized understanding;Returned demonstration       PT Short Term Goals - 10/12/18 5110      PT SHORT TERM GOAL #1   Title  Patient to be independent with initial HEP.    Time  3    Period  Weeks    Status  Achieved        PT Long Term Goals - 10/05/18 2111      PT LONG TERM GOAL #1   Title  Patient to be independent with advanced HEP.    Time  6    Period  Weeks    Status  On-going      PT LONG TERM GOAL #2   Title  Patient to demonstrate L shoulder AROM/PROM without pain limiting and WFL.     Time  6    Period  Weeks    Status  On-going      PT LONG TERM GOAL #3   Title  Patient to demonstrate L shoulder strength >=4+/5.    Time  6    Period  Weeks    Status  On-going      PT LONG TERM GOAL #4   Title  Patient to report 1 full day at work without use of shoulder compensations and without pain limiting.     Time  6    Period  Weeks    Status  On-going            Plan - 10/26/18 7356    Clinical Impression Statement  Patient arrived to session with report of improvement in L shoulder- notes that she can now use the L shoulder to steer her car. Began session with manual therapy to increase L shoulder PROM and decrease soft tissue restriction. Patient demonstrated good ROM in flexion and abduction, most painful in IR. Did note tenderness in L infraspinatus/teres group as well as posterior deltoid. Introduced flexion, scaption, and abduction with light weighted resistance with cues to avoid straining and shoulder hiking with good tolerance. Progressed IR/ER and row with increase in banded resistance. Patient required heavy correction of form with IR/ER but with good carryover after cues. Provided patient with red and green  TB to progress these exercises at home. Patient reported understanding. No complaints and no modalities needed at end of session. Patient showing good improvements in L shoulder ROM and strength at this time.     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, mandible surgery, L knee surgery     PT Treatment/Interventions  ADLs/Self Care Home Management;Cryotherapy;Electrical Stimulation;Iontophoresis 4mg /ml Dexamethasone;Functional mobility training;Ultrasound;Moist Heat;Therapeutic activities;Therapeutic exercise;Neuromuscular re-education;Patient/family education;Passive range of motion;Manual techniques;Energy conservation;Dry needling;Splinting;Taping;Vasopneumatic Device    PT Next Visit Plan  progress shoulder strengthening and ROM    Consulted and Agree with Plan of Care  Patient       Patient will benefit from skilled therapeutic intervention in order to improve the following deficits and impairments:  Decreased range of motion, Increased fascial restricitons, Impaired UE functional use, Increased muscle spasms, Decreased activity tolerance, Pain, Improper body mechanics, Impaired flexibility, Hypomobility, Decreased strength, Postural dysfunction  Visit Diagnosis: Stiffness of left shoulder, not elsewhere classified  Chronic left shoulder pain  Other symptoms and signs involving the musculoskeletal system  Abnormal posture     Problem List Patient Active Problem List   Diagnosis Date Noted  . 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 Hypothyroidism 02/05/2010  . GERD 02/05/2010  . FURUNCLE 02/05/2010  . SYSTEMIC LUPUS ERYTHEMATOSUS 02/05/2010     Anette GuarneriYevgeniya Godwin Tedesco, PT, DPT 10/26/18 8:55 AM   [facility] [address]  [address] [place], [place], [address] Phone: [phone]   Fax:  [phone]  Name: Angela Laymelia C  Scott MRN: [id number] Date of Birth: [birth year]

## 2018-11-02 ENCOUNTER — Ambulatory Visit: Payer: Medicare Other

## 2018-11-02 DIAGNOSIS — M25512 Pain in left shoulder: Secondary | ICD-10-CM

## 2018-11-02 DIAGNOSIS — G8929 Other chronic pain: Secondary | ICD-10-CM

## 2018-11-02 DIAGNOSIS — R293 Abnormal posture: Secondary | ICD-10-CM

## 2018-11-02 DIAGNOSIS — M25612 Stiffness of left shoulder, not elsewhere classified: Secondary | ICD-10-CM | POA: Diagnosis not present

## 2018-11-02 DIAGNOSIS — R29898 Other symptoms and signs involving the musculoskeletal system: Secondary | ICD-10-CM

## 2018-11-02 NOTE — Therapy (Signed)
[facility] [address]  [address] [place], [place], [address] Phone: [phone]   Fax:  [phone]  Physical Therapy Treatment  Patient Details  Name: Angela Scott MRN: [id number] Date of Birth: 31-Dec-1950 Referring Provider (PT): Celso Sickle, MD   Encounter Date: 11/02/2018  PT End of Session - 11/02/18 0803    Visit Number  6    Number of Visits  7    Date for PT Re-Evaluation  11/09/18    Authorization Type  [employer]    PT Start Time  0800    PT Stop Time  0827    PT Time Calculation (min)  27 min    Activity Tolerance  Patient tolerated treatment well    Behavior During Therapy  [facility] for tasks assessed/performed       Past Medical History:  Diagnosis Date  . Eustachian tube dysfunction   . GERD (gastroesophageal reflux disease)   . History of Graves' disease   . Hypothyroidism     Past Surgical History:  Procedure Laterality Date  . KNEE SURGERY Left   . LAPAROSCOPIC GASTRIC BANDING    . LAPAROSCOPIC GASTRIC BANDING WITH HIATAL HERNIA REPAIR    . MANDIBLE SURGERY    . MASS EXCISION Left 01/01/2015   Procedure: EXCISION MASS LEFT 1ST WEB SPACE;  Surgeon: Cindee Salt, MD;  Location: [facility];  Service: Orthopedics;  Laterality: Left;    There were no vitals filed for this visit.  Subjective Assessment - 11/02/18 0802    Subjective  Pt. feels she will be ready to transition to home program after next visit.      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  Yes    Pain Score  2     Pain Location   Shoulder    Pain Orientation  Left;Posterior    Pain Type  Chronic pain    Pain Onset  More than a month ago    Pain Frequency  Intermittent    Multiple Pain Sites  No                       [facility] Adult PT Treatment/Exercise - 11/02/18 0813      Self-Care   Self-Care  Other Self-Care Comments    Other Self-Care Comments   Reviewed pt. home program verbally with update with green TB for rows as pt. noting these getting easy       Shoulder Exercises: Standing   External Rotation  Left;10 reps;Strengthening;Theraband    Theraband Level (Shoulder External Rotation)  Level 3 (Green)   green TB on pt. request    Internal Rotation  Left;10 reps;Theraband;Strengthening    Theraband Level (Shoulder Internal Rotation)  Level 3 (Green)   green TB on pt. request    Flexion  Left;10 reps;Strengthening    Flexion Limitations  orange p-ball roll up wall to gentle flexion stretch       Shoulder Exercises: ROM/Strengthening   UBE (Upper Arm Bike)  Lvl 2.0, 3 min forward/3 min backwards     Cybex  Row  15 reps    Cybex Row Limitations  15# low handles; 2nd set mid handles 10#     Wall Pushups  10 reps    Wall Pushups Limitations  orange p-ball    Gentle; cues for elbows by side      Shoulder Exercises: Stretch   Corner Stretch  1 rep;30 seconds    Corner Stretch Limitations  doorway mid              PT Education - 11/02/18 0830    Education Details  Issued pt. green TB for rows; instructed pt. to continue red TB for IR, ER     Person(s) Educated  Patient    Methods  Explanation;Demonstration;Verbal cues;Handout    Comprehension  Verbalized understanding;Returned demonstration;Verbal cues required;Need further instruction       PT Short Term Goals - 10/12/18 4098      PT SHORT TERM GOAL #1   Title  Patient to be independent with initial HEP.    Time  3    Period  Weeks    Status  Achieved        PT Long Term Goals - 10/05/18 1191      PT LONG TERM GOAL #1    Title  Patient to be independent with advanced HEP.    Time  6    Period  Weeks    Status  On-going      PT LONG TERM GOAL #2   Title  Patient to demonstrate L shoulder AROM/PROM without pain limiting and WFL.     Time  6    Period  Weeks    Status  On-going      PT LONG TERM GOAL #3   Title  Patient to demonstrate L shoulder strength >=4+/5.    Time  6    Period  Weeks    Status  On-going      PT LONG TERM GOAL #4   Title  Patient to report 1 full day at work without use of shoulder compensations and without pain limiting.     Time  6    Period  Weeks    Status  On-going            Plan - 11/02/18 4782    Clinical Impression Statement  Pt. reporting she needed to leave early today thus session time limited.  Pt. noting she feels she is ready to transition to home program at end of current POC.  Reports she feels L shoulder ROM continues to improve with reaching behind back getting easier.  Session focusing on discussion and update of HEP to prep for transition to 30-day hold after next session as pt. interested in this.  Pt. reporting initial L shoulder soreness without known trigger to start session which resolved following therex and gentle L shoulder posterior shoulder stretch.  Ended session pain free with green TB issued to pt. for standing HEP band row.  Will plan to perform final goal testing and final HEP update as needed next session.      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  Consulted and Agree with Plan of Care  Patient       Patient will benefit from skilled therapeutic intervention in order to improve the following deficits and impairments:  Decreased range of motion, Increased fascial restricitons, Impaired UE functional use, Increased muscle spasms, Decreased activity tolerance, Pain, Improper body mechanics, Impaired flexibility, Hypomobility, Decreased strength, Postural dysfunction  Visit Diagnosis: Stiffness of left shoulder, not elsewhere classified  Chronic left shoulder pain  Other symptoms and signs involving the musculoskeletal system  Abnormal posture     Problem List Patient Active Problem List   Diagnosis Date Noted  . 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 Hypothyroidism 02/05/2010  . GERD 02/05/2010  . FURUNCLE 02/05/2010  . SYSTEMIC LUPUS ERYTHEMATOSUS 02/05/2010    Kermit Balo, PTA 11/02/18 8:36 AM   [facility] [address]  [address] [place], [place], [address] Phone: [phone]   Fax:  [phone]  Name: LAREYNA FRAVEL MRN: [id number] Date of Birth: 10-Sep-1950

## 2018-11-09 ENCOUNTER — Encounter: Payer: Self-pay | Admitting: Physical Therapy

## 2018-11-09 ENCOUNTER — Ambulatory Visit: Payer: Medicare Other | Attending: Orthopaedic Surgery | Admitting: Physical Therapy

## 2018-11-09 DIAGNOSIS — R29898 Other symptoms and signs involving the musculoskeletal system: Secondary | ICD-10-CM | POA: Diagnosis present

## 2018-11-09 DIAGNOSIS — M25512 Pain in left shoulder: Secondary | ICD-10-CM | POA: Insufficient documentation

## 2018-11-09 DIAGNOSIS — M25612 Stiffness of left shoulder, not elsewhere classified: Secondary | ICD-10-CM | POA: Diagnosis not present

## 2018-11-09 DIAGNOSIS — R293 Abnormal posture: Secondary | ICD-10-CM | POA: Insufficient documentation

## 2018-11-09 DIAGNOSIS — G8929 Other chronic pain: Secondary | ICD-10-CM | POA: Insufficient documentation

## 2018-11-09 NOTE — Therapy (Addendum)
[facility] [address]  [place], [place], [address] Phone: [phone]   Fax:  [phone]  Physical Therapy Treatment  Patient Details  Name: Angela Scott MRN: [id number] Date of Birth: 1951-08-31 Referring Provider (PT): Lajoyce Lauber, MD   Progress Note Reporting Period 09/28/18 to 11/09/18  See note below for Objective Data and Assessment of Progress/Goals.    Encounter Date: 11/09/2018  PT End of Session - 11/09/18 0834    Visit Number  7    Number of Visits  7    Date for PT Re-Evaluation  11/09/18    Authorization Type  [employer]    PT Start Time  [phone]    PT Stop Time  [phone]    PT Time Calculation (min)  39 min    Activity Tolerance  Patient tolerated treatment well    Behavior During Therapy  [facility] for tasks assessed/performed       Past Medical History:  Diagnosis Date  . Eustachian tube dysfunction   . GERD (gastroesophageal reflux disease)   . History of Graves' disease   . Hypothyroidism     Past Surgical History:  Procedure Laterality Date  . KNEE SURGERY Left   . LAPAROSCOPIC GASTRIC BANDING    . LAPAROSCOPIC GASTRIC BANDING WITH HIATAL HERNIA REPAIR    . MANDIBLE SURGERY    . MASS EXCISION Left 01/01/2015   Procedure: EXCISION MASS LEFT 1ST WEB SPACE;  Surgeon: Daryll Brod, MD;  Location: [facility];  Service: Orthopedics;  Laterality: Left;    There were no vitals filed for this visit.  Subjective Assessment - 11/09/18 0756    Subjective  Reports that she has been feeling good except one night that she slept on it "crooked" but it got better. Reports that she is getting bored with PT and would like to wrap up today. Reports 80% improvement since initial eval in ROM, pain levels. Notes that she can not throw with the L hand and better able to reach behind her.     Pertinent History  hypothyroidism, Grave's disease, SLE, GERD, eustachian tube dysfunction, L  1st web space mass excision, mandible surgery, L knee surgery     Diagnostic tests  09/04/18 L shoulder MRI: small partial-thickness bursal surface tear of supraspinatus, tendinosis of infraspinatus, biceps tendon, superior labral tear, subdeltoid bursitis     Patient Stated Goals  "full ROM of my left arm"    Currently in Pain?  No/denies         [facility] PT Assessment - 11/09/18 0001      AROM   Left Shoulder Flexion  140 Degrees    Left Shoulder ABduction  175 Degrees    Left Shoulder Internal Rotation  --   FIR T9   Left Shoulder External Rotation  --   FER T3     PROM   Left Shoulder Flexion  163 Degrees    Left Shoulder ABduction  172 Degrees    Left Shoulder Internal Rotation  85 Degrees    Left Shoulder External Rotation  80 Degrees      Strength   Left Shoulder Flexion  4+/5    Left Shoulder ABduction  4/5    Left Shoulder Internal Rotation  4/5    Left Shoulder External Rotation  4+/5                   [facility] Adult PT Treatment/Exercise - 11/09/18 0001  Shoulder Exercises: Seated   Flexion  Strengthening;Left;10 reps;Weights    Flexion Limitations  yellow TB; cues for thumb up    Other Seated Exercises  L scaption with yellow TB x10   cues to avoid shoulder hiking     Shoulder Exercises: Standing   External Rotation  Left;10 reps;Strengthening;Theraband    Theraband Level (Shoulder External Rotation)  Level 3 (Green)    External Rotation Limitations  cues to maintain thumb up    Internal Rotation  Left;10 reps;Theraband;Strengthening    Theraband Level (Shoulder Internal Rotation)  Level 3 (Green)    Internal Rotation Limitations  cues to maintain thumb up    Row  15 reps;Both;Theraband;Strengthening    Theraband Level (Shoulder Row)  Level 3 (Green)    Row Limitations  cues to maintain elbows bent      Shoulder Exercises: ROM/Strengthening   UBE (Upper Arm Bike)  Lvl 2.0, 3 min forward/3 min backwards       Manual Therapy   Manual Therapy   Passive ROM;Soft tissue mobilization    Manual therapy comments  supine    Passive ROM  L shoulder PROM in all planes to tolerance- no complaints             PT Education - 11/09/18 0833    Education Details  discussion on objective progress with PT thus far, consolidation and update to HEP    Person(s) Educated  Patient    Methods  Explanation;Demonstration;Tactile cues;Verbal cues;Handout    Comprehension  Verbalized understanding;Returned demonstration       PT Short Term Goals - 11/09/18 0759      PT SHORT TERM GOAL #1   Title  Patient to be independent with initial HEP.    Time  3    Period  Weeks    Status  Achieved        PT Long Term Goals - 11/09/18 0759      PT LONG TERM GOAL #1   Title  Patient to be independent with advanced HEP.    Time  6    Period  Weeks    Status  Achieved      PT LONG TERM GOAL #2   Title  Patient to demonstrate L shoulder AROM/PROM without pain limiting and WFL.     Time  6    Period  Weeks    Status  Achieved      PT LONG TERM GOAL #3   Title  Patient to demonstrate L shoulder strength >=4+/5.    Time  6    Period  Weeks    Status  Partially Met   demonstrated improvements in L shoulder flexion and ER strength     PT LONG TERM GOAL #4   Title  Patient to report 1 full day at work without use of shoulder compensations and without pain limiting.     Time  6    Period  Weeks    Status  Achieved   reports that she is no longer compensating and reports no pain at work           Plan - 11/09/18 1143    Clinical Impression Statement  Patient arrived to session with report of 80% improvement in L shoulder since starting PT. Notes improvements in pain levels, ROM, and notices that she is now able to comfortably bear weight through the L arm, reach behind the back, and reports that she is more functional at work. Patient has met  or partially met all goals at this time. Has demonstrated improvements in L shoulder flexion and  ER strength. Has also improved considerably in all planes of L shoulder AROM and PROM. Patient reports that she is no longer having to shrug her shoulders to compensate for shoulder motion, and notes no pain while at work. Reviewed banded exercises with cues required to correct form. Introduced flexion and scaption with light banded resistance with cues to avoid shoulder hiking. Updated HEP with these exercises as they were well-tolerated. Patient reported understanding. Placing patient on 30 day hold at this time d/t satisfaction with CLOF.     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, mandible surgery, L knee surgery     PT Treatment/Interventions  ADLs/Self Care Home Management;Cryotherapy;Electrical Stimulation;Iontophoresis 33m/ml Dexamethasone;Functional mobility training;Ultrasound;Moist Heat;Therapeutic activities;Therapeutic exercise;Neuromuscular re-education;Patient/family education;Passive range of motion;Manual techniques;Energy conservation;Dry needling;Splinting;Taping;Vasopneumatic Device    PT Next Visit Plan  30 day hold at this time    Consulted and Agree with Plan of Care  Patient       Patient will benefit from skilled therapeutic intervention in order to improve the following deficits and impairments:  Decreased range of motion, Increased fascial restricitons, Impaired UE functional use, Increased muscle spasms, Decreased activity tolerance, Pain, Improper body mechanics, Impaired flexibility, Hypomobility, Decreased strength, Postural dysfunction  Visit Diagnosis: Stiffness of left shoulder, not elsewhere classified  Chronic left shoulder pain  Other symptoms and signs involving the musculoskeletal system  Abnormal posture     Problem List Patient Active Problem List   Diagnosis Date Noted  . 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 Hypothyroidism 02/05/2010  .  GERD 02/05/2010  . FURUNCLE 02/05/2010  . SYSTEMIC LUPUS ERYTHEMATOSUS 02/05/2010     YJanene Harvey PT, DPT 11/09/18 11:47 AM   [facility] [place] [address]Phone: [phone]  Fax:  [phone] Name: AARVA SLAUGHMRN: [id number] of Birth: 7June 06, 1952 PHYSICAL THERAPY DISCHARGE SUMMARY  Visits from Start of Care: 7  Current functional level related to goals / functional outcomes: See above clinical impression   Remaining deficits: Decreased shoulder strength   Education / Equipment: HEP  Plan: Patient agrees to discharge.  Patient goals were partially met. Patient is being discharged due to being pleased with the current functional level.  ?????     YJanene Harvey PT, DPT 12/11/18 10:40 AM

## 2019-01-04 ENCOUNTER — Telehealth: Payer: Self-pay | Admitting: Cardiology

## 2019-01-04 NOTE — Telephone Encounter (Signed)
Patient called stating that she is retaining fluid in her legs,feet, and ankles. (  More of the right side. )  States that she is not having shortness of breath.

## 2019-01-04 NOTE — Telephone Encounter (Signed)
Returned pt call. She wanted to make Dr. Diona Browner aware that her legs are starting to swell a little more than usual. She has not worked in 3 weeks due to Covid-19. She is sitting a lot more than normal, but she is going to increase her activity level. She will call back later next week to inform if that helped.

## 2019-03-19 ENCOUNTER — Other Ambulatory Visit: Payer: Self-pay | Admitting: Surgery

## 2019-03-19 DIAGNOSIS — S99922A Unspecified injury of left foot, initial encounter: Secondary | ICD-10-CM

## 2019-03-29 ENCOUNTER — Ambulatory Visit
Admission: RE | Admit: 2019-03-29 | Discharge: 2019-03-29 | Disposition: A | Discharge status: Home / Self Care | Payer: Medicare Other | Source: Ambulatory Visit | Attending: Surgery | Admitting: Surgery

## 2019-03-29 ENCOUNTER — Other Ambulatory Visit: Payer: Self-pay

## 2019-03-29 DIAGNOSIS — S99922A Unspecified injury of left foot, initial encounter: Secondary | ICD-10-CM

## 2019-03-29 MED ORDER — GADOBENATE DIMEGLUMINE 529 MG/ML IV SOLN
20.0000 mL | Freq: Once | INTRAVENOUS | Status: AC | PRN
  Administered 2019-03-29: 20 mL via INTRAVENOUS

## 2019-04-09 DIAGNOSIS — M5416 Radiculopathy, lumbar region: Secondary | ICD-10-CM | POA: Insufficient documentation

## 2019-04-12 ENCOUNTER — Other Ambulatory Visit: Payer: Medicare Other

## 2019-04-15 ENCOUNTER — Other Ambulatory Visit: Payer: Medicare Other

## 2019-04-22 ENCOUNTER — Telehealth: Payer: Self-pay | Admitting: Cardiology

## 2019-04-22 DIAGNOSIS — I493 Ventricular premature depolarization: Secondary | ICD-10-CM

## 2019-04-22 NOTE — Telephone Encounter (Signed)
Noted.  It has been a little over a year since she had an echocardiogram.  With her history of frequent PVCs, let's go ahead and repeat an echocardiogram to make sure that there have been no significant changes.

## 2019-04-22 NOTE — Telephone Encounter (Signed)
Please give pt a call- patient is needing to schedule surgery on her back. She has a compressed nerve and would like to know if she needs to be seen prior to the surgery- pt states she seems to be exerting herself more with walking due to the problems with her back and would like to have an Echo ordered for the [place].    Please give pt a call @ [phone]

## 2019-04-22 NOTE — Telephone Encounter (Signed)
Per patient, with female provider, scheduled to be done at [facility], request sent to [facility] for echo

## 2019-04-22 NOTE — Telephone Encounter (Signed)
Spoke with Pt who states that she is still experiencing lower extremity swelling to her legs and feet. Pt report that she is not SOB and that she does wear compression socks daily. She is considering have back surgery. She would like to have an Echo prior to her surgery. She has discussed the swelling with her Neurosurgeon but would also like to know what Dr. Domenic Polite thinks about it. Pt states that she is more active lately. She reports that she gets up to walk around about every 15 mins. While at the house. Please advise.

## 2019-04-26 ENCOUNTER — Ambulatory Visit (HOSPITAL_BASED_OUTPATIENT_CLINIC_OR_DEPARTMENT_OTHER)
Admission: RE | Admit: 2019-04-26 | Discharge: 2019-04-26 | Disposition: A | Discharge status: Home / Self Care | Payer: Medicare Other | Source: Ambulatory Visit | Attending: Cardiology | Admitting: Cardiology

## 2019-04-26 ENCOUNTER — Other Ambulatory Visit: Payer: Self-pay

## 2019-04-26 DIAGNOSIS — I340 Nonrheumatic mitral (valve) insufficiency: Secondary | ICD-10-CM | POA: Diagnosis not present

## 2019-04-26 DIAGNOSIS — I493 Ventricular premature depolarization: Secondary | ICD-10-CM | POA: Insufficient documentation

## 2019-04-26 NOTE — Progress Notes (Signed)
  Echocardiogram 2D Echocardiogram has been performed.  Cardell Peach 04/26/2019, 10:56 AM

## 2019-09-06 HISTORY — PX: SPINAL FUSION: SHX223

## 2019-09-11 DIAGNOSIS — M48061 Spinal stenosis, lumbar region without neurogenic claudication: Secondary | ICD-10-CM | POA: Insufficient documentation

## 2019-09-12 ENCOUNTER — Other Ambulatory Visit (HOSPITAL_BASED_OUTPATIENT_CLINIC_OR_DEPARTMENT_OTHER): Payer: Self-pay | Admitting: Neurosurgery

## 2019-09-12 ENCOUNTER — Other Ambulatory Visit: Payer: Self-pay | Admitting: Neurosurgery

## 2019-09-12 DIAGNOSIS — M4316 Spondylolisthesis, lumbar region: Secondary | ICD-10-CM

## 2019-09-21 ENCOUNTER — Ambulatory Visit (HOSPITAL_BASED_OUTPATIENT_CLINIC_OR_DEPARTMENT_OTHER)
Admission: RE | Admit: 2019-09-21 | Discharge: 2019-09-21 | Disposition: A | Discharge status: Home / Self Care | Payer: Medicare Other | Source: Ambulatory Visit | Attending: Neurosurgery | Admitting: Neurosurgery

## 2019-09-21 ENCOUNTER — Other Ambulatory Visit: Payer: Self-pay

## 2019-09-21 DIAGNOSIS — M4316 Spondylolisthesis, lumbar region: Secondary | ICD-10-CM | POA: Insufficient documentation

## 2019-09-21 MED ORDER — GADOBUTROL 1 MMOL/ML IV SOLN
9.5000 mL | Freq: Once | INTRAVENOUS | Status: AC | PRN
  Administered 2019-09-21: 13:00:00 9.5 mL via INTRAVENOUS

## 2019-09-23 ENCOUNTER — Other Ambulatory Visit (HOSPITAL_COMMUNITY)
Admission: RE | Admit: 2019-09-23 | Discharge: 2019-09-23 | Disposition: A | Discharge status: Home / Self Care | Payer: Medicare Other | Source: Ambulatory Visit | Attending: Neurosurgery | Admitting: Neurosurgery

## 2019-09-23 DIAGNOSIS — Z20822 Contact with and (suspected) exposure to covid-19: Secondary | ICD-10-CM | POA: Diagnosis not present

## 2019-09-23 DIAGNOSIS — Z01812 Encounter for preprocedural laboratory examination: Secondary | ICD-10-CM | POA: Diagnosis present

## 2019-09-24 ENCOUNTER — Encounter (HOSPITAL_COMMUNITY): Payer: Self-pay | Admitting: Neurosurgery

## 2019-09-24 LAB — NOVEL CORONAVIRUS, NAA (HOSP ORDER, SEND-OUT TO REF LAB; TAT 18-24 HRS): SARS-CoV-2, NAA: NOT DETECTED

## 2019-09-24 NOTE — Progress Notes (Signed)
Denies chest pain or shob. Reports Cardiologist is Dr. Diona Browner. Patient highly concerned about history of difficult intubation x 2. The first at [facility] in 2007 and the second occurred in 2020 at [facility]. Patient states in both cases she had severe sore throat and coughed up blood for several days post intubation. I attempted to contact [facility] for anesthesia records but they were closed. Patient request no Dietitian and to have a highly experienced CRNA for intubation. Chart sent to anesthesia for review. CRNA director made aware of patient's request for highly experienced CRNA.

## 2019-09-24 NOTE — H&P (Signed)
Patient ID:   [id number] Patient: Angela Scott  Date of Birth: Dec 14, 1950 Visit Type: Office Visit   Date: 09/11/2019 03:00 PM Provider: Danae Orleans. Venetia Maxon MD   This 69 year old female presents for back pain and sciatica.  HISTORY OF PRESENT ILLNESS:  1.  back pain  2.  sciatica  05/31/2019 left L5-S1 microdiskectomy  Patient returns as scheduled, frustrated with persistent left foot and ankle numbness noting some new right foot numbness.  She complains that her gait is "still off" with periodic left sciatic pain hip to ankle. She is able to walk in the pool up to our with no pain; but land exercises cause pain.  She remains apprehensive discussing her current symptoms and her previous discussion with Dr. Venetia Maxon regarding the need for fusion.  I reviewed the patient's current situation with her in detail and believe that she is experiencing neurogenic claudication.  She says her numbness is getting worse in her left foot and now she is beginning to develop right foot numbness.  I believe this relates to her severe spinal stenosis at the L4-5 level.  Previously we had discussed L4-5 decompression and fusion with question of whether the L3-4 level needs to be addressed as well.  I have recommended to the patient that she go ahead with surgery.  This will consist of decompression and fusion at the L4-5 level.  We will proceed with repeat imaging of her lumbar spine to make sure that there has been no progression of L5-S1 and L3-4 degenerative changes.  We will change the surgical plan based on new information from her MRI if necessary         Medical/Surgical/Interim History Reviewed, no change.  Last detailed document date:04/09/2019.     PAST MEDICAL HISTORY, SURGICAL HISTORY, FAMILY HISTORY, SOCIAL HISTORY AND REVIEW OF SYSTEMS I have reviewed the patient's past medical, surgical, family and social history as well as the comprehensive review of systems as included on the [facility] history form dated 04/09/2019, which I have signed.  Family History:  Reviewed, no changes.  Last detailed document date:04/09/2019.   Social History: Reviewed, no changes. Last detailed document date: 04/09/2019.    MEDICATIONS: (added, continued or stopped this visit) Started Medication Directions Instruction Stopped  05/31/2019 hydrocodone 5 mg-acetaminophen 325 mg tablet take 1 tablet by oral route  every 6 hours as needed for pain    05/31/2019 methocarbamol 500 mg tablet take 1 tablet by oral route 3 times every day as needed     Motrin IB      Synthroid 25 mcg tablet      vitamins  ORAL        ALLERGIES: Ingredient Reaction Medication Name Comment  AMOXICILLIN TRIHYDRATE  Augmentin   POTASSIUM CLAVULANATE  Augmentin   METHOTREXATE     MORPHINE     CLINDAMYCIN      Reviewed, no changes.    PHYSICAL EXAM:   Vitals Date Temp F BP Pulse Ht In Wt Lb BMI BSA Pain Score  09/11/2019 97.2 133/84 80 65 219.6 36.54  2/10      IMPRESSION:   Worsening symptoms related to neurogenic claudication with bilateral lower extremity numbness and inability to stand for any length of time because of severity of her discomfort.  When she sits the pain and numbness get better.  PLAN:  I have recommended decompression and fusion at the L4-5 level but I have also recommended that an MRI be obtained to  make sure there is no significant nerve compression at other levels.  We will address discrepancies based on her repeat scan prior to surgery and may modify the surgical plan if necessary the patient is anxious to go ahead with surgery and recognizes that the problem is not getting better on its own.  She also recognizes that with a COVID pandemic we will either need to do the surgery now or weight up to several months before we authorized to go ahead and do so.  Orders: Labs: Assessment Test Status  M54.9 BUN And Creatinine Scheduled  Diagnostic  Procedures: Assessment Procedure  M43.16 MRI Spine/lumb With & W/o Contrast  M54.16 Lumbar Spine- AP/Lat  Instruction(s)/Education: Assessment Instruction  R03.0 Lifestyle education  Z68.36 Dietary management education, guidance, and counseling  Miscellaneous: Assessment   M43.16 LSO Brace   Completed Orders (this encounter) Order Details Reason Side Interpretation Result Initial Treatment Date Region  Lifestyle education Patient will monitor and contact primary care physician if needed.        Dietary management education, guidance, and counseling Encouraged patient to eat well balanced diet.         Assessment/Plan   # Detail Type Description   1. Assessment Lumbar foraminal stenosis (M48.061).       2. Assessment Herniated nucleus pulposus, lumbar (M51.26).       3. Assessment Dorsalgia, unspecified (M54.9).   Plan Orders BUN And Creatinine to be performed.       4. 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       5. 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   Plan Orders LSO Brace.       6. Assessment Lumbar radiculopathy (M54.16).       7. Assessment Elevated blood-pressure reading, w/o diagnosis of htn (R03.0).       8. Assessment Body mass index (BMI) 36.0-36.9, adult (Z68.36).   Plan Orders Today's instructions / counseling include(s) Dietary management education, guidance, and counseling. Clinical information/comments: Encouraged patient to eat well balanced diet.         Pain Management Plan Pain Scale: 2/10. Method: Numeric Pain Intensity Scale. Location: back. Onset: 09/11/2019. Duration: varies. Quality: discomforting. Pain management follow-up plan of care: Patient will continue medication management..              Provider:  Marchia Meiers. Vertell Limber MD  09/15/2019 03:22 PM    Dictation edited by: Marchia Meiers. Vertell Limber    CC Providers: Erline Levine MD  [address] [place], [place]  [address]               Electronically signed by Marchia Meiers. Vertell Limber MD on 09/15/2019 03:22 PM

## 2019-09-25 NOTE — Progress Notes (Addendum)
Anesthesia Chart Review:  Case: [id number] Date/Time: 09/26/19 1137   Procedure: Lumbar 4-5 Posterior lumbar interbody fusion (N/A Back) - Lumbar 4-5 Posterior lumbar interbody fusion   Anesthesia type: General   Pre-op diagnosis: Spondylolisthesis, Lumbar region   Location: [facility] OR ROOM 21 / [facility]   Surgeons: Erline Levine, MD      DISCUSSION: Patient is a 69 year old female scheduled for the above procedure.  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 DIFFICULT INTUBATION 03/07/05 and reported severe sore throat after 06/2019 procedure at [place]. 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  She is followed by cardiologist Dr. Domenic Polite for palpitations/PVCs. She requested preoperative echo given upcoming back surgery with some LE edema. Echo done in August and showed normal LVEF without significant valvular abnormalities.  09/23/19 COVID-19 tet negative. She is a same day work-up, so she will get labs, EKG, and further evaluation by her anesthesia team on the day of surgery. I have requested urgent medical records from [place] in attempt to obtain past anesthesia records given reported DIFFICULT INTUBATION history. If records received then I will plan to update note. (UPDATE 09/26/19 9:10 AM: [facility] unable to locate anesthesia records from 2006.)   PROVIDERS: Patient, No Pcp Per Rozann Lesches, MD is cardiologist. Last office visit 07/26/18 with phone communication in 04/2019 regarding preoperative echo. At that time, walking on the treadmill for 30 minutes at a time without dizziness, syncope or chest pain. No real bothersome palpitations. He wrote, "Frequent PVCs, outflow tract origin.  She is actually less symptomatic at this time, has  not had any chest pain or syncope.  PVCs represented 3% of total beats by cardiac monitoring and LV as well as RV function are normal.  We will continue with observation at this point.  We discussed possibility of other treatment options including beta-blocker, antiarrhythmics, and EP referral if symptoms escalate."   LABS: She is for labs on the day of surgery.   IMAGES: MRI L-spine 09/21/19: IMPRESSION: 1. No significant interval change from prior MRI. 2. Severe canal stenosis at L4-5 and moderate-to-severe canal stenosis at L3-4. 3. No foraminal stenosis at any level.   EKG: Last EKG from 02/09/2018 showed: Sinus rhythm with PVCs and pattern of bigeminy. - Incomplete right bundle branch block.  She is for updated EKG on the day of surgery.   CV: Echo 04/26/2019: IMPRESSIONS  1. The left ventricle has normal systolic function with an ejection fraction of 60-65%. The cavity size was normal. Left ventricular diastolic parameters were normal.  2. The right ventricle has normal systolic function. The cavity was normal. There is no increase in right ventricular wall thickness.  3. The aorta is abnormal unless otherwise noted.  4. There is mild dilatation of the ascending aorta measuring 38 mm.   24 hour Event monitor 02/19/2018: Study Highlights  Sinus rhythm with occasional PACs and frequent PVCs. No sustained arrhythmias.  Symptoms correlated with PVCs.   Nuclear stress test 02/16/2007: Impression: There is no sign of scar or ischemia. EF 69%.   Past Medical History:  Diagnosis Date  . Bigeminy    intermittent  . Bronchitis   . Difficult intubation    states x 2 with Lap Band 2007 at [facility] and at [facility] 06/2019; reports  severe sore throat for both surgeries and coughed up blood post surgery x 1 week  . Eustachian tube dysfunction   . GERD (gastroesophageal reflux disease)   . History of Graves' disease   . Hypothyroidism   . Lupus (HCC)   . PVC's  (premature ventricular contractions)     Past Surgical History:  Procedure Laterality Date  . KNEE SURGERY Left   . LAPAROSCOPIC GASTRIC BANDING    . LAPAROSCOPIC GASTRIC BANDING WITH HIATAL HERNIA REPAIR    . LUMBAR LAMINECTOMY    . MANDIBLE SURGERY    . MASS EXCISION Left 01/01/2015   Procedure: EXCISION MASS LEFT 1ST WEB SPACE;  Surgeon: Cindee Salt, MD;  Location: [facility];  Service: Orthopedics;  Laterality: Left;    MEDICATIONS: No current facility-administered medications for this encounter.   Marland Kitchen ibuprofen (ADVIL,MOTRIN) 200 MG tablet  . liothyronine (CYTOMEL) 50 MCG tablet  . Multiple Vitamin (MULTIVITAMIN WITH MINERALS) TABS tablet  . SYNTHROID 200 MCG tablet  . CYTOMEL 25 MCG tablet    Shonna Chock, PA-C Surgical Short Stay/Anesthesiology [facility] [place] Phone [phone] [facility] Phone [phone] 09/25/2019 4:14 PM

## 2019-09-26 ENCOUNTER — Encounter (HOSPITAL_COMMUNITY)
Admission: RE | Disposition: A | Discharge status: Home / Self Care | Payer: Self-pay | Source: Home / Self Care | Attending: Neurosurgery

## 2019-09-26 ENCOUNTER — Observation Stay (HOSPITAL_COMMUNITY)
Admission: RE | Admit: 2019-09-26 | Discharge: 2019-09-27 | Disposition: A | Discharge status: Home / Self Care | Payer: Medicare Other | Attending: Neurosurgery | Admitting: Neurosurgery

## 2019-09-26 ENCOUNTER — Other Ambulatory Visit: Payer: Self-pay

## 2019-09-26 ENCOUNTER — Encounter (HOSPITAL_COMMUNITY): Payer: Self-pay | Admitting: Neurosurgery

## 2019-09-26 ENCOUNTER — Inpatient Hospital Stay (HOSPITAL_COMMUNITY): Payer: Medicare Other | Admitting: Vascular Surgery

## 2019-09-26 ENCOUNTER — Inpatient Hospital Stay (HOSPITAL_COMMUNITY): Payer: Medicare Other

## 2019-09-26 DIAGNOSIS — M5116 Intervertebral disc disorders with radiculopathy, lumbar region: Secondary | ICD-10-CM | POA: Diagnosis not present

## 2019-09-26 DIAGNOSIS — M4316 Spondylolisthesis, lumbar region: Secondary | ICD-10-CM | POA: Diagnosis present

## 2019-09-26 DIAGNOSIS — E890 Postprocedural hypothyroidism: Secondary | ICD-10-CM | POA: Diagnosis not present

## 2019-09-26 DIAGNOSIS — Z9884 Bariatric surgery status: Secondary | ICD-10-CM | POA: Insufficient documentation

## 2019-09-26 DIAGNOSIS — L930 Discoid lupus erythematosus: Secondary | ICD-10-CM | POA: Insufficient documentation

## 2019-09-26 DIAGNOSIS — M48062 Spinal stenosis, lumbar region with neurogenic claudication: Secondary | ICD-10-CM | POA: Diagnosis not present

## 2019-09-26 DIAGNOSIS — Z79899 Other long term (current) drug therapy: Secondary | ICD-10-CM | POA: Diagnosis not present

## 2019-09-26 DIAGNOSIS — F17200 Nicotine dependence, unspecified, uncomplicated: Secondary | ICD-10-CM | POA: Insufficient documentation

## 2019-09-26 DIAGNOSIS — R030 Elevated blood-pressure reading, without diagnosis of hypertension: Secondary | ICD-10-CM | POA: Diagnosis not present

## 2019-09-26 DIAGNOSIS — Z79890 Hormone replacement therapy (postmenopausal): Secondary | ICD-10-CM | POA: Diagnosis not present

## 2019-09-26 DIAGNOSIS — Z419 Encounter for procedure for purposes other than remedying health state, unspecified: Secondary | ICD-10-CM

## 2019-09-26 HISTORY — DX: Failed or difficult intubation, initial encounter: T88.4XXA

## 2019-09-26 HISTORY — DX: Ventricular premature depolarization: I49.3

## 2019-09-26 HISTORY — DX: Bronchitis, not specified as acute or chronic: J40

## 2019-09-26 LAB — TYPE AND SCREEN
ABO/RH(D): A POS
Antibody Screen: NEGATIVE

## 2019-09-26 LAB — ABO/RH: ABO/RH(D): A POS

## 2019-09-26 SURGERY — POSTERIOR LUMBAR FUSION 2 LEVEL
Anesthesia: General | Site: Back

## 2019-09-26 MED ORDER — FLEET ENEMA 7-19 GM/118ML RE ENEM: 1.0000 | ENEMA | Freq: Once | RECTAL | Status: DC | PRN

## 2019-09-26 MED ORDER — CEFAZOLIN SODIUM-DEXTROSE 2-4 GM/100ML-% IV SOLN
2.0000 g | Freq: Three times a day (TID) | INTRAVENOUS | Status: AC
  Administered 2019-09-26: 2 g via INTRAVENOUS
  Filled 2019-09-26 (×2): qty 100

## 2019-09-26 MED ORDER — LIDOCAINE HCL 4 % MT SOLN
OROMUCOSAL | Status: DC | PRN
  Administered 2019-09-26: 5 mL via TOPICAL

## 2019-09-26 MED ORDER — ROCURONIUM BROMIDE 10 MG/ML (PF) SYRINGE
PREFILLED_SYRINGE | INTRAVENOUS | Status: AC
  Filled 2019-09-26: qty 10

## 2019-09-26 MED ORDER — CHLORHEXIDINE GLUCONATE CLOTH 2 % EX PADS: 6.0000 | MEDICATED_PAD | Freq: Once | CUTANEOUS | Status: DC

## 2019-09-26 MED ORDER — TRAMADOL HCL 50 MG PO TABS
50.0000 mg | ORAL_TABLET | Freq: Four times a day (QID) | ORAL | Status: DC | PRN
  Administered 2019-09-26 – 2019-09-27 (×3): 50 mg via ORAL
  Filled 2019-09-26 (×3): qty 1

## 2019-09-26 MED ORDER — THROMBIN 20000 UNITS EX SOLR
CUTANEOUS | Status: AC
  Filled 2019-09-26: qty 20000

## 2019-09-26 MED ORDER — FENTANYL CITRATE (PF) 250 MCG/5ML IJ SOLN
INTRAMUSCULAR | Status: DC | PRN
  Administered 2019-09-26: 150 ug via INTRAVENOUS
  Administered 2019-09-26: 100 ug via INTRAVENOUS

## 2019-09-26 MED ORDER — ALUM & MAG HYDROXIDE-SIMETH 200-200-20 MG/5ML PO SUSP: 30.0000 mL | Freq: Four times a day (QID) | ORAL | Status: DC | PRN

## 2019-09-26 MED ORDER — DOCUSATE SODIUM 100 MG PO CAPS
100.0000 mg | ORAL_CAPSULE | Freq: Two times a day (BID) | ORAL | Status: DC
  Administered 2019-09-27: 100 mg via ORAL
  Filled 2019-09-26: qty 1

## 2019-09-26 MED ORDER — SUGAMMADEX SODIUM 200 MG/2ML IV SOLN
INTRAVENOUS | Status: DC | PRN
  Administered 2019-09-26 (×2): 100 mg via INTRAVENOUS

## 2019-09-26 MED ORDER — LIOTHYRONINE SODIUM 25 MCG PO TABS
50.0000 ug | ORAL_TABLET | ORAL | Status: DC
  Filled 2019-09-26: qty 2

## 2019-09-26 MED ORDER — KCL IN DEXTROSE-NACL 20-5-0.45 MEQ/L-%-% IV SOLN: INTRAVENOUS | Status: DC

## 2019-09-26 MED ORDER — METHOCARBAMOL 1000 MG/10ML IJ SOLN
500.0000 mg | Freq: Four times a day (QID) | INTRAVENOUS | Status: DC | PRN
  Filled 2019-09-26: qty 5

## 2019-09-26 MED ORDER — METHOCARBAMOL 500 MG PO TABS
ORAL_TABLET | ORAL | Status: AC
  Filled 2019-09-26: qty 1

## 2019-09-26 MED ORDER — LEVOTHYROXINE SODIUM 100 MCG PO TABS: 200.0000 ug | ORAL_TABLET | Freq: Every day | ORAL | Status: DC

## 2019-09-26 MED ORDER — LIDOCAINE 2% (20 MG/ML) 5 ML SYRINGE
INTRAMUSCULAR | Status: DC | PRN
  Administered 2019-09-26: 20 mg via INTRAVENOUS

## 2019-09-26 MED ORDER — ALBUMIN HUMAN 5 % IV SOLN: INTRAVENOUS | Status: DC | PRN

## 2019-09-26 MED ORDER — 0.9 % SODIUM CHLORIDE (POUR BTL) OPTIME
TOPICAL | Status: DC | PRN
  Administered 2019-09-26: 1000 mL

## 2019-09-26 MED ORDER — BUPIVACAINE HCL (PF) 0.5 % IJ SOLN
INTRAMUSCULAR | Status: AC
  Filled 2019-09-26: qty 30

## 2019-09-26 MED ORDER — HYDROCODONE-ACETAMINOPHEN 5-325 MG PO TABS: 2.0000 | ORAL_TABLET | ORAL | Status: DC | PRN

## 2019-09-26 MED ORDER — DEXAMETHASONE SODIUM PHOSPHATE 10 MG/ML IJ SOLN
INTRAMUSCULAR | Status: AC
  Filled 2019-09-26: qty 1

## 2019-09-26 MED ORDER — DEXAMETHASONE SODIUM PHOSPHATE 10 MG/ML IJ SOLN
INTRAMUSCULAR | Status: DC | PRN
  Administered 2019-09-26: 10 mg via INTRAVENOUS

## 2019-09-26 MED ORDER — METHOCARBAMOL 500 MG PO TABS
500.0000 mg | ORAL_TABLET | Freq: Four times a day (QID) | ORAL | Status: DC | PRN
  Administered 2019-09-26 – 2019-09-27 (×3): 500 mg via ORAL
  Filled 2019-09-26 (×2): qty 1

## 2019-09-26 MED ORDER — ONDANSETRON HCL 4 MG/2ML IJ SOLN: 4.0000 mg | Freq: Once | INTRAMUSCULAR | Status: DC | PRN

## 2019-09-26 MED ORDER — THROMBIN 5000 UNITS EX SOLR
OROMUCOSAL | Status: DC | PRN
  Administered 2019-09-26: 11:00:00 5 mL via TOPICAL

## 2019-09-26 MED ORDER — ROCURONIUM BROMIDE 10 MG/ML (PF) SYRINGE
PREFILLED_SYRINGE | INTRAVENOUS | Status: DC | PRN
  Administered 2019-09-26: 20 mg via INTRAVENOUS
  Administered 2019-09-26: 100 mg via INTRAVENOUS
  Administered 2019-09-26: 10 mg via INTRAVENOUS

## 2019-09-26 MED ORDER — POLYETHYLENE GLYCOL 3350 17 G PO PACK: 17.0000 g | PACK | Freq: Every day | ORAL | Status: DC | PRN

## 2019-09-26 MED ORDER — LIDOCAINE-EPINEPHRINE 1 %-1:100000 IJ SOLN
INTRAMUSCULAR | Status: AC
  Filled 2019-09-26: qty 1

## 2019-09-26 MED ORDER — BUPIVACAINE HCL (PF) 0.5 % IJ SOLN
INTRAMUSCULAR | Status: DC | PRN
  Administered 2019-09-26: 5 mL

## 2019-09-26 MED ORDER — THROMBIN 20000 UNITS EX SOLR
CUTANEOUS | Status: DC | PRN
  Administered 2019-09-26: 20 mL via TOPICAL

## 2019-09-26 MED ORDER — PANTOPRAZOLE SODIUM 40 MG IV SOLR: 40.0000 mg | Freq: Every day | INTRAVENOUS | Status: DC

## 2019-09-26 MED ORDER — PROPOFOL 10 MG/ML IV BOLUS
INTRAVENOUS | Status: AC
  Filled 2019-09-26: qty 20

## 2019-09-26 MED ORDER — HYDROMORPHONE HCL 1 MG/ML IJ SOLN: 1.0000 mg | INTRAMUSCULAR | Status: DC | PRN

## 2019-09-26 MED ORDER — KETOROLAC TROMETHAMINE 15 MG/ML IJ SOLN
INTRAMUSCULAR | Status: AC
  Administered 2019-09-26: 16:00:00 15 mg
  Filled 2019-09-26: qty 1

## 2019-09-26 MED ORDER — BISACODYL 10 MG RE SUPP: 10.0000 mg | Freq: Every day | RECTAL | Status: DC | PRN

## 2019-09-26 MED ORDER — ONDANSETRON HCL 4 MG PO TABS: 4.0000 mg | ORAL_TABLET | Freq: Four times a day (QID) | ORAL | Status: DC | PRN

## 2019-09-26 MED ORDER — MIDAZOLAM HCL 5 MG/5ML IJ SOLN
INTRAMUSCULAR | Status: DC | PRN
  Administered 2019-09-26: 1 mg via INTRAVENOUS

## 2019-09-26 MED ORDER — CEFAZOLIN SODIUM-DEXTROSE 2-4 GM/100ML-% IV SOLN
INTRAVENOUS | Status: AC
  Filled 2019-09-26: qty 100

## 2019-09-26 MED ORDER — LIDOCAINE 2% (20 MG/ML) 5 ML SYRINGE
INTRAMUSCULAR | Status: AC
  Filled 2019-09-26: qty 10

## 2019-09-26 MED ORDER — ZOLPIDEM TARTRATE 5 MG PO TABS: 5.0000 mg | ORAL_TABLET | Freq: Every evening | ORAL | Status: DC | PRN

## 2019-09-26 MED ORDER — ADULT MULTIVITAMIN W/MINERALS CH
1.0000 | ORAL_TABLET | Freq: Every day | ORAL | Status: DC
  Administered 2019-09-26 – 2019-09-27 (×2): 1 via ORAL
  Filled 2019-09-26 (×2): qty 1

## 2019-09-26 MED ORDER — ACETAMINOPHEN 650 MG RE SUPP: 650.0000 mg | RECTAL | Status: DC | PRN

## 2019-09-26 MED ORDER — SODIUM CHLORIDE 0.9% FLUSH
3.0000 mL | Freq: Two times a day (BID) | INTRAVENOUS | Status: DC
  Administered 2019-09-26: 21:00:00 3 mL via INTRAVENOUS

## 2019-09-26 MED ORDER — SODIUM CHLORIDE 0.9 % IV SOLN: 250.0000 mL | INTRAVENOUS | Status: DC

## 2019-09-26 MED ORDER — OXYCODONE HCL 5 MG PO TABS: 5.0000 mg | ORAL_TABLET | ORAL | Status: DC | PRN

## 2019-09-26 MED ORDER — LACTATED RINGERS IV SOLN: INTRAVENOUS | Status: DC

## 2019-09-26 MED ORDER — ONDANSETRON HCL 4 MG/2ML IJ SOLN
INTRAMUSCULAR | Status: AC
  Filled 2019-09-26: qty 2

## 2019-09-26 MED ORDER — MIDAZOLAM HCL 2 MG/2ML IJ SOLN
INTRAMUSCULAR | Status: AC
  Filled 2019-09-26: qty 2

## 2019-09-26 MED ORDER — LIDOCAINE-EPINEPHRINE 1 %-1:100000 IJ SOLN
INTRAMUSCULAR | Status: DC | PRN
  Administered 2019-09-26: 5 mL

## 2019-09-26 MED ORDER — PROPOFOL 10 MG/ML IV BOLUS
INTRAVENOUS | Status: DC | PRN
  Administered 2019-09-26: 150 mg via INTRAVENOUS

## 2019-09-26 MED ORDER — ARTIFICIAL TEARS OPHTHALMIC OINT
TOPICAL_OINTMENT | OPHTHALMIC | Status: DC | PRN
  Administered 2019-09-26: 1 via OPHTHALMIC

## 2019-09-26 MED ORDER — HYDROMORPHONE HCL 1 MG/ML IJ SOLN: 0.2500 mg | INTRAMUSCULAR | Status: DC | PRN

## 2019-09-26 MED ORDER — PHENYLEPHRINE 40 MCG/ML (10ML) SYRINGE FOR IV PUSH (FOR BLOOD PRESSURE SUPPORT)
PREFILLED_SYRINGE | INTRAVENOUS | Status: DC | PRN
  Administered 2019-09-26 (×2): 80 ug via INTRAVENOUS

## 2019-09-26 MED ORDER — ONDANSETRON HCL 4 MG/2ML IJ SOLN: 4.0000 mg | Freq: Four times a day (QID) | INTRAMUSCULAR | Status: DC | PRN

## 2019-09-26 MED ORDER — PHENOL 1.4 % MT LIQD: 1.0000 | OROMUCOSAL | Status: DC | PRN

## 2019-09-26 MED ORDER — MEPERIDINE HCL 25 MG/ML IJ SOLN: 6.2500 mg | INTRAMUSCULAR | Status: DC | PRN

## 2019-09-26 MED ORDER — ACETAMINOPHEN 325 MG PO TABS: 650.0000 mg | ORAL_TABLET | ORAL | Status: DC | PRN

## 2019-09-26 MED ORDER — SODIUM CHLORIDE 0.9% FLUSH: 3.0000 mL | INTRAVENOUS | Status: DC | PRN

## 2019-09-26 MED ORDER — PHENYLEPHRINE HCL-NACL 10-0.9 MG/250ML-% IV SOLN
INTRAVENOUS | Status: DC | PRN
  Administered 2019-09-26: 25 ug/min via INTRAVENOUS

## 2019-09-26 MED ORDER — PANTOPRAZOLE SODIUM 40 MG PO TBEC
40.0000 mg | DELAYED_RELEASE_TABLET | Freq: Every day | ORAL | Status: DC
  Administered 2019-09-27: 11:00:00 40 mg via ORAL
  Filled 2019-09-26: qty 1

## 2019-09-26 MED ORDER — MENTHOL 3 MG MT LOZG: 1.0000 | LOZENGE | OROMUCOSAL | Status: DC | PRN

## 2019-09-26 MED ORDER — PHENYLEPHRINE 40 MCG/ML (10ML) SYRINGE FOR IV PUSH (FOR BLOOD PRESSURE SUPPORT)
PREFILLED_SYRINGE | INTRAVENOUS | Status: AC
  Filled 2019-09-26: qty 10

## 2019-09-26 MED ORDER — BUPIVACAINE LIPOSOME 1.3 % IJ SUSP
20.0000 mL | Freq: Once | INTRAMUSCULAR | Status: DC
  Filled 2019-09-26: qty 20

## 2019-09-26 MED ORDER — FENTANYL CITRATE (PF) 250 MCG/5ML IJ SOLN
INTRAMUSCULAR | Status: AC
  Filled 2019-09-26: qty 5

## 2019-09-26 MED ORDER — ONDANSETRON HCL 4 MG/2ML IJ SOLN
INTRAMUSCULAR | Status: DC | PRN
  Administered 2019-09-26: 4 mg via INTRAVENOUS

## 2019-09-26 MED ORDER — BUPIVACAINE LIPOSOME 1.3 % IJ SUSP
INTRAMUSCULAR | Status: DC | PRN
  Administered 2019-09-26: 20 mL

## 2019-09-26 MED ORDER — CEFAZOLIN SODIUM-DEXTROSE 2-4 GM/100ML-% IV SOLN
2.0000 g | INTRAVENOUS | Status: AC
  Administered 2019-09-26: 12:00:00 2 g via INTRAVENOUS

## 2019-09-26 MED ORDER — THROMBIN 5000 UNITS EX SOLR
CUTANEOUS | Status: AC
  Filled 2019-09-26: qty 5000

## 2019-09-26 SURGICAL SUPPLY — 75 items
ADH SKN CLS APL DERMABOND .7 (GAUZE/BANDAGES/DRESSINGS) ×2
BASKET BONE COLLECTION (BASKET) ×4 IMPLANT
BLADE CLIPPER SURG (BLADE) IMPLANT
BONE CANC CHIPS 40CC CAN1/2 (Bone Implant) ×4 IMPLANT
BUR MATCHSTICK NEURO 3.0 LAGG (BURR) ×4 IMPLANT
BUR PRECISION FLUTE 5.0 (BURR) ×4 IMPLANT
CAGE MAS PLIF 9X9X23-8 LUMBAR (Cage) ×12 IMPLANT
CANISTER SUCT 3000ML PPV (MISCELLANEOUS) ×4 IMPLANT
CARTRIDGE OIL MAESTRO DRILL (MISCELLANEOUS) ×2 IMPLANT
CHIPS CANC BONE 40CC CAN1/2 (Bone Implant) ×2 IMPLANT
CONT SPEC 4OZ CLIKSEAL STRL BL (MISCELLANEOUS) ×4 IMPLANT
COVER BACK TABLE 60X90IN (DRAPES) ×4 IMPLANT
COVER WAND RF STERILE (DRAPES) ×4 IMPLANT
DECANTER SPIKE VIAL GLASS SM (MISCELLANEOUS) ×4 IMPLANT
DERMABOND ADVANCED (GAUZE/BANDAGES/DRESSINGS) ×2
DERMABOND ADVANCED .7 DNX12 (GAUZE/BANDAGES/DRESSINGS) ×2 IMPLANT
DIFFUSER DRILL AIR PNEUMATIC (MISCELLANEOUS) ×4 IMPLANT
DRAPE C-ARM 42X72 X-RAY (DRAPES) ×4 IMPLANT
DRAPE C-ARMOR (DRAPES) ×4 IMPLANT
DRAPE LAPAROTOMY 100X72X124 (DRAPES) ×4 IMPLANT
DRAPE SURG 17X23 STRL (DRAPES) ×4 IMPLANT
DRSG OPSITE POSTOP 4X8 (GAUZE/BANDAGES/DRESSINGS) ×3 IMPLANT
DURAPREP 26ML APPLICATOR (WOUND CARE) ×4 IMPLANT
ELECT BLADE 4.0 EZ CLEAN MEGAD (MISCELLANEOUS) ×4
ELECT REM PT RETURN 9FT ADLT (ELECTROSURGICAL) ×4
ELECTRODE BLDE 4.0 EZ CLN MEGD (MISCELLANEOUS) ×1 IMPLANT
ELECTRODE REM PT RTRN 9FT ADLT (ELECTROSURGICAL) ×2 IMPLANT
EVACUATOR 1/8 PVC DRAIN (DRAIN) IMPLANT
GAUZE 4X4 16PLY RFD (DISPOSABLE) ×4 IMPLANT
GAUZE SPONGE 4X4 12PLY STRL (GAUZE/BANDAGES/DRESSINGS) ×4 IMPLANT
GLOVE BIO SURGEON STRL SZ8 (GLOVE) ×8 IMPLANT
GLOVE BIOGEL PI IND STRL 8 (GLOVE) ×4 IMPLANT
GLOVE BIOGEL PI IND STRL 8.5 (GLOVE) ×4 IMPLANT
GLOVE BIOGEL PI INDICATOR 8 (GLOVE) ×4
GLOVE BIOGEL PI INDICATOR 8.5 (GLOVE) ×4
GLOVE ECLIPSE 8.0 STRL XLNG CF (GLOVE) ×8 IMPLANT
GLOVE EXAM NITRILE XL STR (GLOVE) IMPLANT
GOWN STRL REUS W/ TWL LRG LVL3 (GOWN DISPOSABLE) ×3 IMPLANT
GOWN STRL REUS W/ TWL XL LVL3 (GOWN DISPOSABLE) ×4 IMPLANT
GOWN STRL REUS W/TWL 2XL LVL3 (GOWN DISPOSABLE) ×8 IMPLANT
GOWN STRL REUS W/TWL LRG LVL3 (GOWN DISPOSABLE) ×12
GOWN STRL REUS W/TWL XL LVL3 (GOWN DISPOSABLE) ×8
HEMOSTAT POWDER KIT SURGIFOAM (HEMOSTASIS) ×4 IMPLANT
KIT BASIN OR (CUSTOM PROCEDURE TRAY) ×4 IMPLANT
KIT INFUSE SMALL (Orthopedic Implant) ×3 IMPLANT
KIT POSITION SURG JACKSON T1 (MISCELLANEOUS) ×4 IMPLANT
KIT TURNOVER KIT B (KITS) ×4 IMPLANT
MILL MEDIUM DISP (BLADE) ×3 IMPLANT
NEEDLE HYPO 21X1.5 SAFETY (NEEDLE) ×4 IMPLANT
NEEDLE HYPO 25X1 1.5 SAFETY (NEEDLE) ×4 IMPLANT
NEEDLE SPNL 18GX3.5 QUINCKE PK (NEEDLE) IMPLANT
NS IRRIG 1000ML POUR BTL (IV SOLUTION) ×4 IMPLANT
OIL CARTRIDGE MAESTRO DRILL (MISCELLANEOUS) ×4
PACK LAMINECTOMY NEURO (CUSTOM PROCEDURE TRAY) ×4 IMPLANT
PAD ARMBOARD 7.5X6 YLW CONV (MISCELLANEOUS) IMPLANT
PATTIES SURGICAL .5 X.5 (GAUZE/BANDAGES/DRESSINGS) IMPLANT
PATTIES SURGICAL .5 X1 (DISPOSABLE) IMPLANT
PATTIES SURGICAL 1X1 (DISPOSABLE) IMPLANT
ROD RELINE-O 5.5X75 LORD (Rod) ×6 IMPLANT
SCREW LOCK RELINE 5.5 TULIP (Screw) ×18 IMPLANT
SCREW RELINE-O POLY 6.5X45 (Screw) ×18 IMPLANT
SPONGE LAP 4X18 RFD (DISPOSABLE) IMPLANT
SPONGE SURGIFOAM ABS GEL 100 (HEMOSTASIS) ×4 IMPLANT
STAPLER SKIN PROX WIDE 3.9 (STAPLE) ×4 IMPLANT
SUT VIC AB 1 CT1 18XBRD ANBCTR (SUTURE) ×4 IMPLANT
SUT VIC AB 1 CT1 8-18 (SUTURE) ×8
SUT VIC AB 2-0 CT1 18 (SUTURE) ×5 IMPLANT
SUT VIC AB 3-0 SH 8-18 (SUTURE) ×8 IMPLANT
SYR 20ML LL LF (SYRINGE) ×3 IMPLANT
SYR 5ML LL (SYRINGE) IMPLANT
TOWEL GREEN STERILE (TOWEL DISPOSABLE) ×4 IMPLANT
TOWEL GREEN STERILE FF (TOWEL DISPOSABLE) ×4 IMPLANT
TRAP SPECIMEN MUCOUS 40CC (MISCELLANEOUS) ×4 IMPLANT
TRAY FOLEY MTR SLVR 16FR STAT (SET/KITS/TRAYS/PACK) ×4 IMPLANT
WATER STERILE IRR 1000ML POUR (IV SOLUTION) ×4 IMPLANT

## 2019-09-26 NOTE — Progress Notes (Signed)
Patient is awake, conversant.  Less numbness in legs.  Strength full.  Doing well.

## 2019-09-26 NOTE — Progress Notes (Signed)
oob chair, her request and tolerated well/ desires foley removed, and is adamant about not taking narcotics

## 2019-09-26 NOTE — Brief Op Note (Signed)
09/26/2019  3:14 PM  PATIENT:  Angela Scott  69 y.o. female  PRE-OPERATIVE DIAGNOSIS:  Spondylolisthesis, Lumbar region, severe lumbar spinal stenosis, lumbar radiculopathy, lumbago L 34 and L 45 levels  POST-OPERATIVE DIAGNOSIS:  Spondylolisthesis, Lumbar region, severe lumbar spinal stenosis, lumbar radiculopathy, lumbago L 34 and L 45 levels   PROCEDURE:  Procedure(s): Lumbar three-four, lumbar four-five Posterior lumbar interbody fusion with PEEK cages, autograft, pedicle screw fixation, posterolateral arthrodesis  SURGEON:  Surgeon(s) and Role:    Erline Levine, MD - Primary    * Ashok Pall, MD - Assisting  PHYSICIAN ASSISTANT:   ASSISTANTS: [place], RN   ANESTHESIA:   general  EBL:  200 mL   BLOOD ADMINISTERED:none  DRAINS: none   LOCAL MEDICATIONS USED:  MARCAINE    and LIDOCAINE   SPECIMEN:  No Specimen  DISPOSITION OF SPECIMEN:  N/A  COUNTS:  YES  TOURNIQUET:  * No tourniquets in log *  DICTATION: Patient is 69 year old woman with  spondylolisthesis, stenosis, DDD, radiculopathy L 34, L 45. She has neurogenic claudication, numbness and weakness. It was elected to take her to surgery for decompression and fusion at L 34 and L 45 levels.    Procedure: Patient was placed in a prone position on the [place] table after smooth and uncomplicated induction of general endotracheal anesthesia. Her low back was prepped and draped in usual sterile fashion with betadine scrub and DuraPrep after localizing correct levels. Area of incision was infiltrated with local lidocaine. Incision was made to the lumbodorsal fascia was incised and exposure was performed of the L 34, L 45 spinous processes laminae facet joint and transverse processes. Intraoperative x-ray was obtained which confirmed correct orientation. A total laminectomy of L 3, L 4  was performed with disarticulation of the facet joints at this level and thorough decompression was performed of both L 3, L 4 , L5   nerve roots along with the common dural tube. There was densely adherent spondylytic material compressing the thecal sac and both L4 and L5 nerve roots.  Decompression was greater than would be typically performed for simple interbody fusion. There was severe stenosis, particularly at the L 45 level.  A thorough discectomy and preparation of the endplates was performed at both the L 34 and L 45 levels.  The interspaces were packed with PEEK cages (9 x 9 x 23 mm x 8 degrees at both levels) . Bone autograft was packed within the interspace bilaterally along with small BMP kit. 10 cc of autograft was placed in the L 45 interspace and at the L 34 level medial to the second cage.  The posterolateral region was extensively decorticated and pedicle probes were placed at L 3 , L 4 and L 5 bilaterally. Intraoperative fluoroscopy confirmed correct orientationin the AP and lateral plane. 45 x 6.5 mm pedicle screws were placed at L 5 bilaterally and 45 x 6.5 mm screws placed at L5 bilaterally and 45x 6.76m screws were placed at L4 bilaterally. Final x-rays demonstrated well-positioned interbody grafts and pedicle screw fixation. 75 mm lordotic rods were placed and locked down in situ and the posterolateral region was packed with the remaining bone graft and bone allograft chips (40 cc each side) with remaining BMP. Long-acting Marcaine was injected in the deep musculature.    Fascia was closed with 1 Vicryl sutures skin edges were reapproximated 2 and 3-0 Vicryl sutures. The wound is dressed with Dermabond and an occlusive dressing. The patient was extubated in the  operating room and taken to recovery in stable satisfactory condition having tolerated the operation well. Counts were correct at the end of the case.    PLAN OF CARE: Admit to inpatient   PATIENT DISPOSITION:  PACU - hemodynamically stable.   Delay start of Pharmacological VTE agent (>24hrs) due to surgical blood loss or risk of bleeding: yes

## 2019-09-26 NOTE — Progress Notes (Signed)
Orthopedic Tech Progress Note Patient Details:  Angela Scott 1951/08/14 [id number] RN said patient has brace Patient ID: Angela Scott, female   DOB: [birth year], 69 y.o.   MRN: [id number]   Donald Pore 09/26/2019, 5:05 PM

## 2019-09-26 NOTE — Anesthesia Preprocedure Evaluation (Signed)
Anesthesia Evaluation  Patient identified by MRN, date of birth, ID band Patient awake    Reviewed: Allergy & Precautions, NPO status , Patient's Chart, lab work & pertinent test results  Airway Mallampati: I  TM Distance: >3 FB Neck ROM: Full    Dental   Pulmonary Current Smoker,    Pulmonary exam normal        Cardiovascular Normal cardiovascular exam     Neuro/Psych    GI/Hepatic GERD  Medicated and Controlled,  Endo/Other    Renal/GU      Musculoskeletal   Abdominal   Peds  Hematology   Anesthesia Other Findings   Reproductive/Obstetrics                             Anesthesia Physical Anesthesia Plan  ASA: III  Anesthesia Plan: General   Post-op Pain Management:    Induction: Intravenous  PONV Risk Score and Plan: 2 and Ondansetron and Midazolam  Airway Management Planned: Oral ETT  Additional Equipment:   Intra-op Plan:   Post-operative Plan: Extubation in OR  Informed Consent: I have reviewed the patients History and Physical, chart, labs and discussed the procedure including the risks, benefits and alternatives for the proposed anesthesia with the patient or authorized representative who has indicated his/her understanding and acceptance.       Plan Discussed with: CRNA and Surgeon  Anesthesia Plan Comments:         Anesthesia Quick Evaluation

## 2019-09-26 NOTE — Anesthesia Procedure Notes (Signed)
Procedure Name: Intubation Date/Time: 09/26/2019 11:35 AM Performed by: Trinna Post., CRNA Pre-anesthesia Checklist: Patient identified, Emergency Drugs available, Suction available, Patient being monitored and Timeout performed Patient Re-evaluated:Patient Re-evaluated prior to induction Oxygen Delivery Method: Circle system utilized Preoxygenation: Pre-oxygenation with 100% oxygen Induction Type: IV induction Ventilation: Mask ventilation without difficulty Laryngoscope Size: Mac and 3 Grade View: Grade I Tube type: Oral Tube size: 7.0 mm Number of attempts: 1 Airway Equipment and Method: Stylet Placement Confirmation: ETT inserted through vocal cords under direct vision,  positive ETCO2 and breath sounds checked- equal and bilateral Secured at: 22 cm Tube secured with: Tape Dental Injury: Teeth and Oropharynx as per pre-operative assessment  Comments: Pt documented to have difficult intubations x2 in the past. We were prepared with glidescope in the room but first visualized airway with MAC 3, grade 1 view of cords with no difficulty intubating. LTA administered prior to tube placement. Glidescope was not needed.

## 2019-09-26 NOTE — Progress Notes (Signed)
Arrived 3500 with pain score 2/10

## 2019-09-26 NOTE — Transfer of Care (Signed)
Immediate Anesthesia Transfer of Care Note  Patient: Angela Scott  Procedure(s) Performed: Lumbar three-four, lumbar four-five Posterior lumbar interbody fusion (Back)  Patient Location: PACU  Anesthesia Type:General  Level of Consciousness: awake, alert  and oriented  Airway & Oxygen Therapy: Patient Spontanous Breathing and Patient connected to face mask oxygen  Post-op Assessment: Report given to RN and Post -op Vital signs reviewed and stable  Post vital signs: Reviewed and stable  Last Vitals:  Vitals Value Taken Time  BP 122/73 09/26/19 1525  Temp 36.5 C 09/26/19 1525  Pulse 84 09/26/19 1527  Resp 31 09/26/19 1527  SpO2 99 % 09/26/19 1527  Vitals shown include unvalidated device data.  Last Pain:  Vitals:   09/26/19 1024  TempSrc:   PainSc: 0-No pain      Patients Stated Pain Goal: 3 (09/26/19 1024)  Complications: No apparent anesthesia complications

## 2019-09-26 NOTE — Op Note (Signed)
09/26/2019  3:14 PM  PATIENT:  Angela Scott  69 y.o. female  PRE-OPERATIVE DIAGNOSIS:  Spondylolisthesis, Lumbar region, severe lumbar spinal stenosis, lumbar radiculopathy, lumbago L 34 and L 45 levels  POST-OPERATIVE DIAGNOSIS:  Spondylolisthesis, Lumbar region, severe lumbar spinal stenosis, lumbar radiculopathy, lumbago L 34 and L 45 levels   PROCEDURE:  Procedure(s): Lumbar three-four, lumbar four-five Posterior lumbar interbody fusion with PEEK cages, autograft, pedicle screw fixation, posterolateral arthrodesis  SURGEON:  Surgeon(s) and Role:    Erline Levine, MD - Primary    * Ashok Pall, MD - Assisting  PHYSICIAN ASSISTANT:   ASSISTANTS: Poteat, RN   ANESTHESIA:   general  EBL:  200 mL   BLOOD ADMINISTERED:none  DRAINS: none   LOCAL MEDICATIONS USED:  MARCAINE    and LIDOCAINE   SPECIMEN:  No Specimen  DISPOSITION OF SPECIMEN:  N/A  COUNTS:  YES  TOURNIQUET:  * No tourniquets in log *  DICTATION: Patient is 69 year old woman with  spondylolisthesis, stenosis, DDD, radiculopathy L 34, L 45. She has neurogenic claudication, numbness and weakness. It was elected to take her to surgery for decompression and fusion at L 34 and L 45 levels.    Procedure: Patient was placed in a prone position on the [place] table after smooth and uncomplicated induction of general endotracheal anesthesia. Her low back was prepped and draped in usual sterile fashion with betadine scrub and DuraPrep after localizing correct levels. Area of incision was infiltrated with local lidocaine. Incision was made to the lumbodorsal fascia was incised and exposure was performed of the L 34, L 45 spinous processes laminae facet joint and transverse processes. Intraoperative x-ray was obtained which confirmed correct orientation. A total laminectomy of L 3, L 4  was performed with disarticulation of the facet joints at this level and thorough decompression was performed of both L 3, L 4 , L5   nerve roots along with the common dural tube. There was densely adherent spondylytic material compressing the thecal sac and both L4 and L5 nerve roots.  Decompression was greater than would be typically performed for simple interbody fusion. There was severe stenosis, particularly at the L 45 level.  A thorough discectomy and preparation of the endplates was performed at both the L 34 and L 45 levels.  The interspaces were packed with PEEK cages (9 x 9 x 23 mm x 8 degrees at both levels) . Bone autograft was packed within the interspace bilaterally along with small BMP kit. 10 cc of autograft was placed in the L 45 interspace and at the L 34 level medial to the second cage.  The posterolateral region was extensively decorticated and pedicle probes were placed at L 3 , L 4 and L 5 bilaterally. Intraoperative fluoroscopy confirmed correct orientationin the AP and lateral plane. 45 x 6.5 mm pedicle screws were placed at L 5 bilaterally and 45 x 6.5 mm screws placed at L5 bilaterally and 45x 6.64m screws were placed at L4 bilaterally. Final x-rays demonstrated well-positioned interbody grafts and pedicle screw fixation. 75 mm lordotic rods were placed and locked down in situ and the posterolateral region was packed with the remaining bone graft and bone allograft chips (40 cc each side) with remaining BMP. Long-acting Marcaine was injected in the deep musculature.    Fascia was closed with 1 Vicryl sutures skin edges were reapproximated 2 and 3-0 Vicryl sutures. The wound is dressed with Dermabond and an occlusive dressing. The patient was extubated in the  operating room and taken to recovery in stable satisfactory condition having tolerated the operation well. Counts were correct at the end of the case.    PLAN OF CARE: Admit to inpatient   PATIENT DISPOSITION:  PACU - hemodynamically stable.   Delay start of Pharmacological VTE agent (>24hrs) due to surgical blood loss or risk of bleeding: yes

## 2019-09-26 NOTE — Interval H&P Note (Signed)
History and Physical Interval Note:  09/26/2019 11:05 AM  Angela Scott  has presented today for surgery, with the diagnosis of Spondylolisthesis, Lumbar region.  The various methods of treatment have been discussed with the patient and family. After consideration of risks, benefits and other options for treatment, the patient has consented to  Procedure(s): Lumbar 4-5 and L 3-4 Posterior lumbar interbody fusion as a surgical intervention.  The patient's history has been reviewed, patient examined, no change in status, stable for surgery.  I have reviewed the patient's chart and labs.  Questions were answered to the patient's satisfaction.     Dorian Heckle

## 2019-09-27 DIAGNOSIS — M4316 Spondylolisthesis, lumbar region: Secondary | ICD-10-CM | POA: Diagnosis not present

## 2019-09-27 MED ORDER — METHOCARBAMOL 500 MG PO TABS: 500.0000 mg | ORAL_TABLET | Freq: Four times a day (QID) | ORAL | 1 refills | Status: DC | PRN

## 2019-09-27 MED ORDER — TRAMADOL HCL 50 MG PO TABS: 50.0000 mg | ORAL_TABLET | Freq: Four times a day (QID) | ORAL | 1 refills | Status: DC | PRN

## 2019-09-27 NOTE — Anesthesia Postprocedure Evaluation (Signed)
Anesthesia Post Note  Patient: Angela Scott  Procedure(s) Performed: Lumbar three-four, lumbar four-five Posterior lumbar interbody fusion (Back)     Anesthesia Post Evaluation  Last Vitals:  Vitals:   09/26/19 2326 09/27/19 0357  BP: (!) 115/59 (!) 115/59  Pulse: 91 90  Resp: 18 18  Temp: 37.2 C 37.4 C  SpO2: 94% 94%    Last Pain:  Vitals:   09/27/19 0357  TempSrc: Oral  PainSc:                  [name] DAVID

## 2019-09-27 NOTE — Evaluation (Signed)
Occupational Therapy Evaluation Patient Details Name: Angela Scott MRN: [id number] DOB: May 15, 1951 Today's Date: 09/27/2019    History of Present Illness Pt is a 68 year old woman admitted for L3-4, L4-5 PLIF. PMH: laminectomy in October 2020, thyroid disease, PVCs, lupus, jaw sx, gastric banding, smoker.    Clinical Impression   Pt educated in back precautions, compensatory techniques ADL and IADL. Pt verbalized understanding of all information. No further OT needs.    Follow Up Recommendations  No OT follow up    Equipment Recommendations  None recommended by OT    Recommendations for Other Services       Precautions / Restrictions Precautions Precautions: Back Precaution Booklet Issued: Yes (comment) Required Braces or Orthoses: Spinal Brace Spinal Brace: Applied in sitting position;Lumbar corset Restrictions Weight Bearing Restrictions: No      Mobility Bed Mobility               General bed mobility comments: pt up walking about her room upon arrival, verbally educated in log roll   Transfers Overall transfer level: Modified independent Equipment used: None                  Balance                                           ADL either performed or assessed with clinical judgement   ADL Overall ADL's : Modified independent                                       General ADL Comments: Educated pt in IADL to avoid, compensatory strategies for ADL, body mechnanic, bed mobility using log roll technique.      Vision Baseline Vision/History: Wears glasses Wears Glasses: Reading only Patient Visual Report: No change from baseline       Perception     Praxis      Pertinent Vitals/Pain Pain Assessment: No/denies pain     Hand Dominance Right   Extremity/Trunk Assessment Upper Extremity Assessment Upper Extremity Assessment: Overall WFL for tasks assessed   Lower Extremity Assessment Lower Extremity  Assessment: Defer to PT evaluation   Cervical / Trunk Assessment Cervical / Trunk Assessment: Other exceptions Cervical / Trunk Exceptions: s/p PLIF   Communication Communication Communication: No difficulties   Cognition Arousal/Alertness: Awake/alert Behavior During Therapy: WFL for tasks assessed/performed Overall Cognitive Status: Within Functional Limits for tasks assessed                                     General Comments       Exercises     Shoulder Instructions      Home Living Family/patient expects to be discharged to:: Private residence Living Arrangements: Alone Available Help at Discharge: Family;Friend(s);Available PRN/intermittently Type of Home: House Home Access: Stairs to enter [employer] of Steps: 2 Entrance Stairs-Rails: Right Home Layout: Two level;1/2 bath on main level Alternate Level Stairs-Number of Steps: 13   Bathroom Shower/Tub: Occupational psychologist: Handicapped height     Home Equipment: Hand held shower head;Adaptive equipment Adaptive Equipment: Reacher;Long-handled sponge        Prior Functioning/Environment Level of Independence: Independent  Comments: has a housekeeper weekly         OT Problem List:        OT Treatment/Interventions:      OT Goals(Current goals can be found in the care plan section) Acute Rehab OT Goals Patient Stated Goal: be able to do a toe raise on the L  OT Frequency:     Barriers to D/C:            Co-evaluation              AM-PAC OT "6 Clicks" Daily Activity     Outcome Measure Help from another person eating meals?: None Help from another person taking care of personal grooming?: None Help from another person toileting, which includes using toliet, bedpan, or urinal?: None Help from another person bathing (including washing, rinsing, drying)?: None Help from another person to put on and taking off regular upper body clothing?:  None Help from another person to put on and taking off regular lower body clothing?: None 6 Click Score: 24   End of Session Equipment Utilized During Treatment: Back brace  Activity Tolerance: Patient tolerated treatment well Patient left: Other (comment)(in bathroom)  OT Visit Diagnosis: Other abnormalities of gait and mobility (R26.89)                Time: 3151-7616 OT Time Calculation (min): 17 min Charges:  OT General Charges $OT Visit: 1 Visit OT Evaluation $OT Eval Low Complexity: 1 Low  Martie Round, OTR/L Acute Rehabilitation Services Pager: [phone] Office: [phone]  Evern Bio 09/27/2019, 9:14 AM

## 2019-09-27 NOTE — Progress Notes (Addendum)
Subjective: Patient reports "I don't really hurt, just some numbnees, but that's better than it was"  Objective: Vital signs in last 24 hours: Temp:  [97.3 F (36.3 C)-99.4 F (37.4 C)] 98.9 F (37.2 C) (01/22 0755) Pulse Rate:  [82-109] 98 (01/22 1145) Resp:  [18-19] 18 (01/22 1145) BP: (95-122)/(59-91) 114/91 (01/22 1145) SpO2:  [94 %-98 %] 97 % (01/22 1145)  Intake/Output from previous day: 01/21 0701 - 01/22 0700 In: 1710 [P.O.:360; I.V.:1000; IV Piggyback:350] Out: 650 [Urine:450; Blood:200] Intake/Output this shift: No intake/output data recorded.  Alert, conversant. Reports no lumbar pain at present, improving (decreased) left foot and bilat thigh numbness. Good strength BLE. Incision without erythema, swelling or drainage beneath honeycomb and Dermabond.   Lab Results: No results for input(s): WBC, HGB, HCT, PLT in the last 72 hours. BMET No results for input(s): NA, K, CL, CO2, GLUCOSE, BUN, CREATININE, CALCIUM in the last 72 hours.  Studies/Results: DG Lumbar Spine 2-3 Views  Result Date: 09/26/2019 CLINICAL DATA:  L4-5 fusion EXAM: LUMBAR SPINE - 2-3 VIEW; DG C-ARM 1-60 MIN COMPARISON:  09/21/2019 FLUOROSCOPY TIME:  Fluoroscopy Time:  56 seconds Radiation Exposure Index (if provided by the fluoroscopic device): Not available Number of Acquired Spot Images: 9 FINDINGS: Initial spot films demonstrate localization in the lower lumbar spine. Subsequent retractors and instruments are noted at the L3-4 and L4-5 interspace. Subsequent fusion at L3-4 and L4-5 is noted with pedicle screw placement at all 3 levels. IMPRESSION: Lumbar fusion from L3-L5. Electronically Signed   By: Alcide Clever M.D.   On: 09/26/2019 15:03   DG C-Arm 1-60 Min  Result Date: 09/26/2019 CLINICAL DATA:  L4-5 fusion EXAM: LUMBAR SPINE - 2-3 VIEW; DG C-ARM 1-60 MIN COMPARISON:  09/21/2019 FLUOROSCOPY TIME:  Fluoroscopy Time:  56 seconds Radiation Exposure Index (if provided by the fluoroscopic device): Not  available Number of Acquired Spot Images: 9 FINDINGS: Initial spot films demonstrate localization in the lower lumbar spine. Subsequent retractors and instruments are noted at the L3-4 and L4-5 interspace. Subsequent fusion at L3-4 and L4-5 is noted with pedicle screw placement at all 3 levels. IMPRESSION: Lumbar fusion from L3-L5. Electronically Signed   By: Alcide Clever M.D.   On: 09/26/2019 15:03    Assessment/Plan: improving  LOS: 1 day  Ok per drStern to d/c to home. Tramadol 50mg  and Robaxin 500mg  will be eRx'ed to her pharmacy. She verbalizes understanding of d/c instructions and agrees to call office to schedule 3 week f/u appt.   09/27/2019, 1:06 PM   Patient is doing well.  Discharge home.

## 2019-09-27 NOTE — Care Management Obs Status (Signed)
MEDICARE OBSERVATION STATUS NOTIFICATION   Patient Details  Name: Angela Scott MRN: [id number] Date of Birth: [birth year]   Medicare Observation Status Notification Given:  Yes    Epifanio Lesches, RN 09/27/2019, 1:33 PM

## 2019-09-27 NOTE — Care Management CC44 (Signed)
Condition Code 44 Documentation Completed  Patient Details  Name: Angela Scott MRN: [id number] Date of Birth: 1950-11-13   Condition Code 44 given:  Yes Patient signature on Condition Code 44 notice:  Yes Documentation of 2 MD's agreement:  Yes Code 44 added to claim:  Yes    Epifanio Lesches, RN 09/27/2019, 1:33 PM

## 2019-09-27 NOTE — Discharge Summary (Signed)
Physician Discharge Summary  Patient ID: Angela Scott MRN: [id number] DOB/AGE: November 26, 1950 69 y.o.  Admit date: 09/26/2019 Discharge date: 09/27/2019  Admission Diagnoses: Spondylolisthesis, Lumbar region, severe lumbar spinal stenosis, lumbar radiculopathy, lumbago L 34 and L 45 levels    Discharge Diagnoses: Spondylolisthesis, Lumbar region, severe lumbar spinal stenosis, lumbar radiculopathy, lumbago L 34 and L 45 levels s/p Lumbar three-four, lumbar four-five Posterior lumbar interbody fusion with PEEK cages, autograft, pedicle screw fixation, posterolateral arthrodesis    Active Problems:   Spondylolisthesis of lumbar region   Discharged Condition: good  Hospital Course: Angela Scott was admitted for surgery with spondylolisthesis and stenosis with radiculopathy. Following uncomplicated surgery (above) she recovered well and transferred to [facility] for nursing acre and therapies. She is mobilizing well.   Consults: None  Significant Diagnostic Studies: radiology: X-Ray: intra-op  Treatments: surgery: Lumbar three-four, lumbar four-five Posterior lumbar interbody fusion with PEEK cages, autograft, pedicle screw fixation, posterolateral arthrodesis    Discharge Exam: Blood pressure (!) 114/91, pulse 98, temperature 98.9 F (37.2 C), temperature source Oral, resp. rate 18, height 5\' 6"  (1.676 m), weight 97.1 kg, SpO2 97 %. Alert, conversant. Reports no lumbar pain at present, improving (decreased) left foot and bilat thigh numbness. Good strength BLE. Incision without erythema, swelling or drainage beneath honeycomb and Dermabond.    Disposition:  Discharge to home. Tramadol 50mg  and Robaxin 500mg  will be eRx'ed to her pharmacy. She verbalizes understanding of d/c instructions and agrees to call office to schedule 3 week f/u appt.     Discharge Instructions    Diet - low sodium heart healthy   Complete by: As directed    Increase activity slowly    Complete by: As directed      Allergies as of 09/27/2019      Reactions   Clindamycin/lincomycin    C-Dif toxicity   Methotrexate Derivatives Anaphylaxis   Fever,lymphadnopathy   Other    absorbable sutures; Reports they do not absorb and create an abscess      Medication List    STOP taking these medications   ibuprofen 200 MG tablet Commonly known as: ADVIL     TAKE these medications   liothyronine 50 MCG tablet Commonly known as: CYTOMEL Take 50 mcg by mouth every other day.   methocarbamol 500 MG tablet Commonly known as: ROBAXIN Take 1 tablet (500 mg total) by mouth every 6 (six) hours as needed for muscle spasms.   multivitamin with minerals Tabs tablet Take 1 tablet by mouth daily.   Synthroid 200 MCG tablet Generic drug: levothyroxine TAKE 1 TABLET DAILY What changed:   how much to take  when to take this   traMADol 50 MG tablet Commonly known as: ULTRAM Take 1-2 tablets (50-100 mg total) by mouth every 6 (six) hours as needed for moderate pain.        Signed: , MD 09/27/2019, 1:26 PM

## 2019-09-27 NOTE — Evaluation (Signed)
Physical Therapy Evaluation and Discharge Patient Details Name: Angela Scott MRN: [id number] DOB: 08-Feb-1951 Today's Date: 09/27/2019   History of Present Illness  Pt is a 69 year old woman admitted for L3-4, L4-5 PLIF. PMH: laminectomy in October 2020, thyroid disease, PVCs, lupus, jaw sx, gastric banding, smoker.   Clinical Impression  Patient evaluated by Physical Therapy with no further acute PT needs identified. All education has been completed and the patient has no further questions. Pt was able to demonstrate transfers and ambulation with gross modified independence and no AD. Pt expressing frustration over back precautions and not feeling a significant difference in LE's immediately after surgery. We discussed healing process and necessity of maintaining back precautions from a therapy perspective. Pt eager to know the exact timeline for healing and physiological processes that are making her feel like she does right now, crying at times out of frustration. Encouraged walking only at this time per our typical protocol. Pt was also educated on precautions, brace application/wearing schedule, appropriate activity progression, and car transfer. As pt mobilizing well and at a modified independent level in the hallway without staff assist, will sign off at this time. See below for any follow-up Physical Therapy or equipment needs. Thank you for this referral.     Follow Up Recommendations No PT follow up;Supervision - Intermittent    Equipment Recommendations  None recommended by PT    Recommendations for Other Services       Precautions / Restrictions Precautions Precautions: Back Precaution Booklet Issued: Yes (comment) Required Braces or Orthoses: Spinal Brace Spinal Brace: Applied in sitting position;Lumbar corset Restrictions Weight Bearing Restrictions: No      Mobility  Bed Mobility               General bed mobility comments: Pt received sitting in recliner.    Transfers Overall transfer level: Modified independent Equipment used: None             General transfer comment: Pt demonstrated proper hand placement on seated surface for safety.   Ambulation/Gait Ambulation/Gait assistance: Modified independent (Device/Increase time) Gait Distance (Feet): 250 Feet Assistive device: None Gait Pattern/deviations: Step-through pattern;Decreased stride length;Trunk flexed Gait velocity: Decreased Gait velocity interpretation: <1.8 ft/sec, indicate of risk for recurrent falls General Gait Details: VC's for improved posture. Pt ambulating well overall however note she has some lateral sway in hips, greater on L than R with advancement of respective LE.   Stairs Stairs: Yes Stairs assistance: Modified independent (Device/Increase time) Stair Management: One rail Left;Alternating pattern;Forwards Number of Stairs: 10 General stair comments: VC's for sequencing and general safety. Pt able to alternate step pattern well without complaints of pain.   Wheelchair Mobility    Modified Rankin (Stroke Patients Only)       Balance Overall balance assessment: Needs assistance Sitting-balance support: Feet supported;No upper extremity supported Sitting balance-Leahy Scale: Fair     Standing balance support: No upper extremity supported;During functional activity Standing balance-Leahy Scale: Fair                               Pertinent Vitals/Pain Pain Assessment: Faces Faces Pain Scale: Hurts little more Pain Location: incision site, quads Pain Descriptors / Indicators: Operative site guarding;Sore;Burning Pain Intervention(s): Limited activity within patient's tolerance;Monitored during session;Repositioned    Home Living Family/patient expects to be discharged to:: Private residence Living Arrangements: Alone Available Help at Discharge: Family;Friend(s);Available PRN/intermittently Type of Home: [place]  Access: Stairs  to enter Entrance Stairs-Rails: Right Entrance Stairs-Number of Steps: 2 Home Layout: Two level;1/2 bath on main level Home Equipment: Hand held shower head;Adaptive equipment      Prior Function Level of Independence: Independent         Comments: has a housekeeper weekly      Hand Dominance   Dominant Hand: Right    Extremity/Trunk Assessment   Upper Extremity Assessment Upper Extremity Assessment: Overall WFL for tasks assessed    Lower Extremity Assessment Lower Extremity Assessment: Generalized weakness(Consistent with pre-op diagnosis)    Cervical / Trunk Assessment Cervical / Trunk Assessment: Other exceptions Cervical / Trunk Exceptions: s/p PLIF  Communication   Communication: No difficulties  Cognition Arousal/Alertness: Awake/alert Behavior During Therapy: Anxious Overall Cognitive Status: Within Functional Limits for tasks assessed                                        General Comments      Exercises     Assessment/Plan    PT Assessment Patent does not need any further PT services  PT Problem List         PT Treatment Interventions      PT Goals (Current goals can be found in the Care Plan section)  Acute Rehab PT Goals Patient Stated Goal: be able to do a toe raise on the L PT Goal Formulation: All assessment and education complete, DC therapy    Frequency     Barriers to discharge        Co-evaluation               AM-PAC PT "6 Clicks" Mobility  Outcome Measure Help needed turning from your back to your side while in a flat bed without using bedrails?: None Help needed moving from lying on your back to sitting on the side of a flat bed without using bedrails?: None Help needed moving to and from a bed to a chair (including a wheelchair)?: None Help needed standing up from a chair using your arms (e.g., wheelchair or bedside chair)?: None Help needed to walk in hospital room?: None Help needed climbing 3-5  steps with a railing? : None 6 Click Score: 24    End of Session Equipment Utilized During Treatment: Back brace Activity Tolerance: Patient tolerated treatment well Patient left: in chair;with call bell/phone within reach Nurse Communication: Mobility status PT Visit Diagnosis: Pain;Other symptoms and signs involving the nervous system (R29.898) Pain - part of body: (back)    Time: 7371-0626 PT Time Calculation (min) (ACUTE ONLY): 23 min   Charges:   PT Evaluation $PT Eval Low Complexity: 1 Low PT Treatments $Gait Training: 8-22 mins        Conni Slipper, PT, DPT Acute Rehabilitation Services Pager: [phone] Office: [phone]   Angela Scott 09/27/2019, 11:47 AM

## 2019-09-27 NOTE — Discharge Instructions (Signed)

## 2019-10-01 MED FILL — Sodium Chloride IV Soln 0.9%: INTRAVENOUS | Qty: 1000 | Status: AC

## 2019-10-01 MED FILL — Heparin Sodium (Porcine) Inj 1000 Unit/ML: INTRAMUSCULAR | Qty: 30 | Status: AC

## 2019-10-31 ENCOUNTER — Encounter (HOSPITAL_BASED_OUTPATIENT_CLINIC_OR_DEPARTMENT_OTHER): Payer: Self-pay | Admitting: Emergency Medicine

## 2019-10-31 ENCOUNTER — Emergency Department (HOSPITAL_BASED_OUTPATIENT_CLINIC_OR_DEPARTMENT_OTHER)
Admission: EM | Admit: 2019-10-31 | Discharge: 2019-10-31 | Disposition: A | Discharge status: Home / Self Care | Payer: Medicare Other | Attending: Emergency Medicine | Admitting: Emergency Medicine

## 2019-10-31 ENCOUNTER — Other Ambulatory Visit: Payer: Self-pay

## 2019-10-31 DIAGNOSIS — T8130XA Disruption of wound, unspecified, initial encounter: Secondary | ICD-10-CM

## 2019-10-31 DIAGNOSIS — T8131XA Disruption of external operation (surgical) wound, not elsewhere classified, initial encounter: Secondary | ICD-10-CM | POA: Insufficient documentation

## 2019-10-31 DIAGNOSIS — Y69 Unspecified misadventure during surgical and medical care: Secondary | ICD-10-CM | POA: Diagnosis not present

## 2019-10-31 DIAGNOSIS — Y998 Other external cause status: Secondary | ICD-10-CM | POA: Insufficient documentation

## 2019-10-31 DIAGNOSIS — W57XXXA Bitten or stung by nonvenomous insect and other nonvenomous arthropods, initial encounter: Secondary | ICD-10-CM | POA: Insufficient documentation

## 2019-10-31 DIAGNOSIS — Y999 Unspecified external cause status: Secondary | ICD-10-CM | POA: Insufficient documentation

## 2019-10-31 DIAGNOSIS — Y929 Unspecified place or not applicable: Secondary | ICD-10-CM | POA: Diagnosis not present

## 2019-10-31 DIAGNOSIS — S40262A Insect bite (nonvenomous) of left shoulder, initial encounter: Secondary | ICD-10-CM | POA: Insufficient documentation

## 2019-10-31 DIAGNOSIS — T819XXA Unspecified complication of procedure, initial encounter: Secondary | ICD-10-CM | POA: Insufficient documentation

## 2019-10-31 DIAGNOSIS — Y939 Activity, unspecified: Secondary | ICD-10-CM | POA: Diagnosis not present

## 2019-10-31 MED ORDER — DOXYCYCLINE HYCLATE 100 MG PO TABS
200.0000 mg | ORAL_TABLET | Freq: Once | ORAL | Status: AC
  Administered 2019-10-31: 04:00:00 200 mg via ORAL
  Filled 2019-10-31: qty 2

## 2019-10-31 NOTE — ED Provider Notes (Signed)
[facility] EMERGENCY DEPARTMENT Provider Note  CSN: [id number] Arrival date & time: 10/31/19 0236  Chief Complaint(s) Insect Bite  HPI Angela Scott is a 69 y.o. female here for tick bite of the left shoulder.  Patient reports that she noted just prior to arrival while lying in bed.  She does not remember seeing it earlier this morning or yesterday.  Reports that she was outside during the day.  She attempted to remove the tick but the tick had remains.  There is no associated rash surrounding the bite.  She has no other associated complaints.  Additionally patient reports lumbar fusion and believes that her incision is opening up due to reaction to resolvable sutures.  She denies any erythema.  No overt discharge.  No fevers or chills.  No worsening back pain.  HPI  Past Medical History Past Medical History:  Diagnosis Date  . Bigeminy    intermittent - Dr Domenic Polite  . Bronchitis   . Difficult intubation    states x 2 with Lap Band 2007 at [facility] and at [facility] 06/2019; reports severe sore throat for both surgeries and coughed up blood post surgery x 1 week  . Dysrhythmia   . Eustachian tube dysfunction   . GERD (gastroesophageal reflux disease)   . History of Graves' disease   . Hypothyroidism   . Lupus ([place])   . PVC's (premature ventricular contractions)    Dr Domenic Polite   Patient Active Problem List   Diagnosis Date Noted  . Spondylolisthesis of lumbar region 09/26/2019  . 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 Hypothyroidism 02/05/2010  . GERD 02/05/2010  . FURUNCLE 02/05/2010  . SYSTEMIC LUPUS ERYTHEMATOSUS 02/05/2010   Home Medication(s) Prior to Admission medications   Medication Sig Start Date End Date Taking? Authorizing Provider  liothyronine (CYTOMEL) 50 MCG tablet Take 50 mcg by mouth every other day.    [provider]  methocarbamol (ROBAXIN) 500 MG tablet Take 1 tablet (500 mg total) by mouth every 6 (six) hours as needed  for muscle spasms. 09/27/19   Erline Levine, MD  Multiple Vitamin (MULTIVITAMIN WITH MINERALS) TABS tablet Take 1 tablet by mouth daily.    [provider]  SYNTHROID 200 MCG tablet TAKE 1 TABLET DAILY Patient taking differently: Take 200 mcg by mouth daily before breakfast.  04/27/12   Johnathan Hausen, MD  traMADol (ULTRAM) 50 MG tablet Take 1-2 tablets (50-100 mg total) by mouth every 6 (six) hours as needed for moderate pain. 09/27/19   Erline Levine, MD                                                                                                                                    Past Surgical History Past Surgical History:  Procedure Laterality Date  . KNEE SURGERY Left   . LAPAROSCOPIC GASTRIC BANDING    . LAPAROSCOPIC GASTRIC BANDING WITH HIATAL HERNIA REPAIR    .  LUMBAR LAMINECTOMY    . MANDIBLE SURGERY    . MASS EXCISION Left 01/01/2015   Procedure: EXCISION MASS LEFT 1ST WEB SPACE;  Surgeon: Cindee Salt, MD;  Location: [facility];  Service: Orthopedics;  Laterality: Left;  . TONSILLECTOMY     Family History Family History  Problem Relation Age of Onset  . Brain cancer Father        Glioblastoma  . Arrhythmia Brother        Ablation    Social History Social History   Tobacco Use  . Smoking status: Current Every Day Smoker    Packs/day: 0.50    Types: Cigarettes  . Smokeless tobacco: Never Used  Substance Use Topics  . Alcohol use: Yes    Comment: occ  . Drug use: No   Allergies Clindamycin/lincomycin, Methotrexate derivatives, and Other  Review of Systems Review of Systems All other systems are reviewed and are negative for acute change except as noted in the HPI  Physical Exam Vital Signs  I have reviewed the triage vital signs BP (!) 155/90 (BP Location: Right Arm)   Pulse 78   Temp 98.2 F (36.8 C) (Oral)   Resp 20   Ht 5\' 5"  (1.651 m)   Wt 95.3 kg   LMP  (LMP Unknown)   SpO2 99%   BMI 34.95 kg/m   Physical Exam Vitals  reviewed.  Constitutional:      General: She is not in acute distress.    Appearance: She is well-developed. She is not diaphoretic.  HENT:     Head: Normocephalic and atraumatic.     Right Ear: External ear normal.     Left Ear: External ear normal.     Nose: Nose normal.  Eyes:     General: No scleral icterus.    Conjunctiva/sclera: Conjunctivae normal.     Comments: Anisocoria of right eye  Neck:     Trachea: Phonation normal.  Cardiovascular:     Rate and Rhythm: Normal rate and regular rhythm.  Pulmonary:     Effort: Pulmonary effort is normal. No respiratory distress.     Breath sounds: No stridor.  Abdominal:     General: There is no distension.  Musculoskeletal:        General: Normal range of motion.     Cervical back: Normal range of motion.  Skin:      Neurological:     Mental Status: She is alert and oriented to person, place, and time.  Psychiatric:        Behavior: Behavior normal.     ED Results and Treatments Labs (all labs ordered are listed, but only abnormal results are displayed) Labs Reviewed - No data to display                                                                                                                       EKG  EKG Interpretation  Date/Time:    Ventricular Rate:  PR Interval:    QRS Duration:   QT Interval:    QTC Calculation:   R Axis:     Text Interpretation:        Radiology No results found.  Pertinent labs & imaging results that were available during my care of the patient were reviewed by me and considered in my medical decision making (see chart for details).  Medications Ordered in ED Medications  doxycycline (VIBRA-TABS) tablet 200 mg (200 mg Oral Given 10/31/19 0345)                                                                                                                                    Procedures .Foreign Body Removal  Date/Time: 10/31/2019 3:31 AM Performed by: Nira Conn, MD  Authorized by: Nira Conn, MD  Consent: Verbal consent obtained. Consent given by: patient Patient understanding: patient states understanding of the procedure being performed Patient identity confirmed: verbally with patient Intake: left shoulder. Anesthesia method: none.  Sedation: Patient sedated: no  Patient restrained: no Complexity: simple 1 objects recovered. Objects recovered: tick head Post-procedure assessment: foreign body removed Patient tolerance: patient tolerated the procedure well with no immediate complications    (including critical care time)  Medical Decision Making / ED Course I have reviewed the nursing notes for this encounter and the patient's prior records (if available in EHR or on provided paperwork).   Angela Scott was evaluated in Emergency Department on 10/31/2019 for the symptoms described in the history of present illness. She was evaluated in the context of the global COVID-19 pandemic, which necessitated consideration that the patient might be at risk for infection with the SARS-CoV-2 virus that causes COVID-19. Institutional protocols and algorithms that pertain to the evaluation of patients at risk for COVID-19 are in a state of rapid change based on information released by regulatory bodies including the CDC and federal and state organizations. These policies and algorithms were followed during the patient's care in the ED.  1. Tick bite Tick head removed.  PPx Doxy dose given.    2. Wound dehiscence No associated infection noted. Recommend wet to dry dressings and close f/o with NSU. No need for emergent imaging at this time.      Final Clinical Impression(s) / ED Diagnoses Final diagnoses:  Tick bite with subsequent removal of tick  Wound dehiscence   The patient appears reasonably screened and/or stabilized for discharge and I doubt any other medical condition or other [facility] requiring further screening, evaluation, or  treatment in the ED at this time prior to discharge. Safe for discharge with strict return precautions.  Disposition: Discharge  Condition: Good  I have discussed the results, Dx and Tx plan with the patient/family who expressed understanding and agree(s) with the plan. Discharge instructions discussed at length. The patient/family was given strict return precautions who verbalized understanding of the instructions. No further questions at time of  discharge.    ED Discharge Orders    None        Follow Up: Primary care provider  Schedule an appointment as soon as possible for a visit  As needed  Maeola Harman, MD [address] [place] [address] [phone]  Schedule an appointment as soon as possible for a visit  For close follow up to assess for wound dehiscence      This chart was dictated using voice recognition software.  Despite best efforts to proofread,  errors can occur which can change the documentation meaning.   Nira Conn, MD 10/31/19 [phone]

## 2019-10-31 NOTE — ED Triage Notes (Signed)
Tick on left shoulder, part of it still under skin. Noticed PTA

## 2019-11-20 DIAGNOSIS — M48061 Spinal stenosis, lumbar region without neurogenic claudication: Secondary | ICD-10-CM | POA: Insufficient documentation

## 2019-12-04 DIAGNOSIS — R030 Elevated blood-pressure reading, without diagnosis of hypertension: Secondary | ICD-10-CM | POA: Insufficient documentation

## 2020-01-23 ENCOUNTER — Encounter: Payer: Self-pay | Admitting: Cardiology

## 2020-01-23 NOTE — Progress Notes (Signed)
Cardiology Office Note  Date: 01/24/2020   ID: Angela, Scott 22-Nov-1950, MRN [id number]  PCP:  Patient, No Pcp Per  Cardiologist:  Nona Dell, MD Electrophysiologist:  None   Chief Complaint  Patient presents with  . Cardiac follow-up    History of Present Illness: Angela Scott is a 69 y.o. female last seen in November 2019.  She presents for a routine visit.  She does not report any progressive sense of palpitations and has had no sudden syncope.  She is recuperating from a spinal fusion earlier in the year, somewhat frustrated that she has gained weight but has not been as active as she was previously.  She looks forward to increasing her activity more.  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  Echocardiogram in August 2020 revealed LVEF 60 to 65% range, normal diastolic function, no major valvular abnormalities, minimal dilatation of the ascending aorta at 38 mm.  I reviewed her ECG today which shows sinus rhythm with small R'  in lead V1 and V2, nonspecific T wave changes, single PVC likely from the outflow tract as noted previously.  Past Medical History:  Diagnosis Date  . Bronchitis   . Difficult intubation    States x 2 with Lap Band 2007 at [facility] and at [facility] 06/2019; reports severe sore throat for both surgeries and coughed up blood post surgery x 1 week  . Eustachian tube dysfunction   . GERD (gastroesophageal reflux disease)   . History of Graves' disease   . Hypothyroidism   . Lupus ([facility])   . PVC's (premature ventricular contractions)    Likely outflow tract origin, LVEF normal    Past Surgical History:  Procedure Laterality Date  . KNEE SURGERY Left   . LAPAROSCOPIC GASTRIC BANDING    . LAPAROSCOPIC GASTRIC BANDING WITH HIATAL HERNIA REPAIR    . LUMBAR LAMINECTOMY    . MANDIBLE SURGERY    . MASS EXCISION Left 01/01/2015   Procedure: EXCISION MASS LEFT 1ST WEB SPACE;  Surgeon: Cindee Salt, MD;  Location: [facility];  Service: Orthopedics;  Laterality: Left;  . TONSILLECTOMY      Current Outpatient Medications  Medication Sig Dispense Refill  . ibuprofen (ADVIL) 400 MG tablet Take 400 mg by mouth as needed.    Marland Kitchen liothyronine (CYTOMEL) 50 MCG tablet Take 50 mcg by mouth every other day.    . methocarbamol (ROBAXIN) 500 MG tablet Take 1 tablet (500 mg total) by mouth every 6 (six) hours as needed for muscle spasms. 60 tablet 1  . Multiple Vitamin (MULTIVITAMIN WITH MINERALS) TABS tablet Take 1 tablet by mouth daily.    Marland Kitchen SYNTHROID 200 MCG tablet TAKE 1 TABLET DAILY (Patient taking differently: Take 200 mcg by mouth daily before breakfast. ) 30 each 12   No current facility-administered medications for this visit.   Allergies:  Clindamycin/lincomycin, Methotrexate derivatives, and Other   ROS:   No sudden dizziness or syncope.  Physical Exam: VS:  BP 134/68   Pulse 79   Ht 5\' 5"  (1.651 m)   Wt 216 lb (98 kg)   LMP  (LMP Unknown)   BMI 35.94 kg/m , BMI Body mass index is 35.94 kg/m.  Wt Readings from Last 3 Encounters:  01/24/20 216 lb (98 kg)  10/31/19 210 lb (95.3 kg)  09/26/19 214 lb (97.1 kg)    General: Patient appears comfortable at rest. HEENT: Conjunctiva and lids normal, wearing a  mask. Lungs: Clear to auscultation, nonlabored breathing at rest. Cardiac: Regular rate and rhythm with single PVC, no S3. Extremities: No pitting edema, distal pulses 2+.  ECG:  An ECG dated 02/09/2018 was personally reviewed today and demonstrated:  Sinus rhythm with ventricular bigeminy and incomplete right bundle branch block.  Recent Labwork:  No interval lab work for review today.  Other Studies Reviewed Today:  Echocardiogram 04/26/2019: 1. The left ventricle has normal systolic function with an ejection  fraction of 60-65%. The cavity size was normal. Left ventricular diastolic  parameters were normal.  2. The right ventricle has normal systolic function. The cavity was   normal. There is no increase in right ventricular wall thickness.  3. The aorta is abnormal unless otherwise noted.  4. There is mild dilatation of the ascending aorta measuring 38 mm.   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  She does not report any progressive symptoms at this time, follow-up ECG reviewed.  Prior cardiac monitor demonstrated 3% PVC burden and her LVEF is normal.  We will continue with observation for now.  Medication Adjustments/Labs and Tests Ordered: Current medicines are reviewed at length with the patient today.  Concerns regarding medicines are outlined above.   Tests Ordered: Orders Placed This Encounter  Procedures  . EKG 12-Lead    Medication Changes: No orders of the defined types were placed in this encounter.   Disposition:  Follow up 1 year in the [place] office.  Signed, Satira Sark, MD, [facility] 01/24/2020 10:44 AM    [facility] [place]. [address], [place], [place] [address] Phone: [phone]; Fax: [phone]

## 2020-01-24 ENCOUNTER — Other Ambulatory Visit: Payer: Self-pay

## 2020-01-24 ENCOUNTER — Encounter: Payer: Self-pay | Admitting: Cardiology

## 2020-01-24 ENCOUNTER — Ambulatory Visit (INDEPENDENT_AMBULATORY_CARE_PROVIDER_SITE_OTHER): Payer: Medicare Other | Admitting: Cardiology

## 2020-01-24 VITALS — BP 134/68 | HR 79 | Ht 65.0 in | Wt 216.0 lb

## 2020-01-24 DIAGNOSIS — I493 Ventricular premature depolarization: Secondary | ICD-10-CM

## 2020-01-24 NOTE — Patient Instructions (Signed)
Medication Instructions:  Your physician recommends that you continue on your current medications as directed. Please refer to the Current Medication list given to you today.  *If you need a refill on your cardiac medications before your next appointment, please call your pharmacy*   Lab Work: None today If you have labs (blood work) drawn today and your tests are completely normal, you will receive your results only by: . MyChart Message (if you have MyChart) OR . A paper copy in the mail If you have any lab test that is abnormal or we need to change your treatment, we will call you to review the results.   Testing/Procedures: None today   Follow-Up: At [facility], you and your health needs are our priority.  As part of our continuing mission to provide you with exceptional heart care, we have created designated Provider Care Teams.  These Care Teams include your primary Cardiologist (physician) and Advanced Practice Providers (APPs -  Physician Assistants and Nurse Practitioners) who all work together to provide you with the care you need, when you need it.  We recommend signing up for the patient portal called "MyChart".  Sign up information is provided on this After Visit Summary.  MyChart is used to connect with patients for Virtual Visits (Telemedicine).  Patients are able to view lab/test results, encounter notes, upcoming appointments, etc.  Non-urgent messages can be sent to your provider as well.   To learn more about what you can do with MyChart, go to [email or web address].    Your next appointment:   12 month(s)  The format for your next appointment:   In Person  Provider:   Samuel McDowell, MD   Other Instructions None       Thank you for choosing [facility] !         

## 2020-02-19 ENCOUNTER — Ambulatory Visit: Payer: PRIVATE HEALTH INSURANCE | Admitting: Physical Therapy

## 2020-03-06 ENCOUNTER — Other Ambulatory Visit: Payer: Self-pay

## 2020-03-06 ENCOUNTER — Ambulatory Visit: Payer: Medicare Other | Attending: Neurosurgery | Admitting: Physical Therapy

## 2020-03-06 ENCOUNTER — Encounter: Payer: Self-pay | Admitting: Physical Therapy

## 2020-03-06 DIAGNOSIS — M6281 Muscle weakness (generalized): Secondary | ICD-10-CM | POA: Insufficient documentation

## 2020-03-06 DIAGNOSIS — R262 Difficulty in walking, not elsewhere classified: Secondary | ICD-10-CM | POA: Insufficient documentation

## 2020-03-06 DIAGNOSIS — R293 Abnormal posture: Secondary | ICD-10-CM | POA: Insufficient documentation

## 2020-03-06 DIAGNOSIS — M545 Low back pain, unspecified: Secondary | ICD-10-CM

## 2020-03-06 DIAGNOSIS — R29898 Other symptoms and signs involving the musculoskeletal system: Secondary | ICD-10-CM | POA: Diagnosis present

## 2020-03-06 DIAGNOSIS — G8929 Other chronic pain: Secondary | ICD-10-CM | POA: Diagnosis present

## 2020-03-06 NOTE — Therapy (Addendum)
[facility] [address]  [address] [place], [place], [address] Phone: [phone]   Fax:  [phone]  Physical Therapy Evaluation  Patient Details  Name: Angela Scott MRN: [id number] Date of Birth: 09-28-1950 Referring Provider (PT): Maeola Harman, MD   Encounter Date: 03/06/2020   PT End of Session - 03/06/20 1118    Visit Number 1    Number of Visits 13    Date for PT Re-Evaluation 04/24/20   offset 1 week d/t patient being out of town   Authorization Type Medicare & [employer]    PT Start Time 0930    PT Stop Time 1014    PT Time Calculation (min) 44 min    Activity Tolerance Patient tolerated treatment well;Patient limited by pain    Behavior During Therapy [facility] for tasks assessed/performed           Past Medical History:  Diagnosis Date   Bronchitis    Difficult intubation    States x 2 with Lap Band 2007 at [facility] and at [facility] 06/2019; reports severe sore throat for both surgeries and coughed up blood post surgery x 1 week   Eustachian tube dysfunction    GERD (gastroesophageal reflux disease)    History of Graves' disease    Hypothyroidism    Lupus (HCC)    PVC's (premature ventricular contractions)    Likely outflow tract origin, LVEF normal    Past Surgical History:  Procedure Laterality Date   KNEE SURGERY Left    LAPAROSCOPIC GASTRIC BANDING     LAPAROSCOPIC GASTRIC BANDING WITH HIATAL HERNIA REPAIR     LUMBAR LAMINECTOMY     MANDIBLE SURGERY     MASS EXCISION Left 01/01/2015   Procedure: EXCISION MASS LEFT 1ST WEB SPACE;  Surgeon: Cindee Salt, MD;  Location: [facility];  Service: Orthopedics;  Laterality: Left;   TONSILLECTOMY      There were no vitals filed for this visit.    Subjective Assessment - 03/06/20 0933    Subjective Patient reports that she underwent an L3-4 and L4-5 PLIF on 09/26/19. Started to notice changes in her gait pattern,  difficulty walking, tingling in her feet, and difficulty raising up on her L toes around January 2020. Denies pain in LBP at that time. Since surgery, she is still having trouble performing a heel raise on her L side and notices "sciatic pain" down the R LE as well as pain over L LB with movement. Pain worse with flexion and L sidebending- better with muscle relaxants. Still lacks feeling in L 4 & 5 digits. Notes that she had trouble with delayed healing of her incision, with incision only being fully healed 1 month ago. Thus, she has not done anything since surgery. Patient would like to return to being more active in order to lose weight.    Pertinent History PVC's. 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 1 hour of driving    How long can you stand comfortably? <5 minutes    How long can you walk comfortably? <5 minutes    Diagnostic tests none recent    Patient Stated Goals work on strength, "be able to walk my dog"    Currently in Pain? Yes    Pain Score 3  Pain Location Back    Pain Orientation Left    Pain Descriptors / Indicators Tightness    Pain Type Chronic pain              [facility] PT Assessment - 03/06/20 0941      Assessment   Medical Diagnosis Dorsalgia    Referring Provider (PT) Maeola Harman, MD    Onset Date/Surgical Date 09/26/19    Hand Dominance Left    Next MD Visit pt unsure    Prior Therapy yes- for shoulder      Precautions   Precautions None      Balance Screen   Has the patient fallen in the past 6 months Yes    How many times? 1   fell while running in the house !1 month ago- denies injury   Has the patient had a decrease in activity level because of a fear of falling?  No    Is the patient reluctant to leave their home because of a fear of falling?   No      Home Environment   Living Environment Private residence    Living Arrangements Alone    Available Help at Discharge Family    Type of Home House    Home Access Stairs to enter    Entrance Stairs-Number of Steps 2    Entrance Stairs-Rails None    Home Layout Two level    Alternate Level Stairs-Number of Steps 13    Alternate Level Stairs-Rails Right;Left      Prior Function   Level of Independence Independent    Vocation Retired    Leisure walking dog, jogging      Cognition   Overall Cognitive Status Within Functional Limits for tasks assessed      Observation/Other Assessments   Observations well-healed midline incision over lumbar spine      Sensation   Light Touch Impaired by gross assessment   decreased over L digits 4&5     Coordination   Gross Motor Movements are Fluid and Coordinated Yes      Posture/Postural Control   Posture/Postural Control Postural limitations    Postural Limitations Rounded Shoulders;Forward head;Increased thoracic kyphosis      ROM / Strength   AROM / PROM / Strength AROM;Strength      AROM   AROM Assessment Site Ankle;Lumbar    Right/Left Ankle Right;Left    Right Ankle Dorsiflexion 18    Right Ankle Plantar Flexion 50    Left Ankle Dorsiflexion 14    Left Ankle Plantar Flexion 35    Lumbar Flexion floor    Lumbar Extension severely limited   midback pain   Lumbar - Right Side Bend distal thigh   L LB and buttock   Lumbar - Left Side Bend distal thigh   L LB and buttock   Lumbar - Right Rotation mod-severely limited   L LB and buttock pain   Lumbar - Left Rotation mod-severely limited   L LB and buttock pain     Strength   Strength Assessment Site Hip;Knee;Ankle    Right/Left Hip Right;Left    Right Hip Flexion 4/5    Right Hip ABduction 4/5    Right Hip ADduction 4+/5    Left Hip Flexion 4/5    Left Hip ABduction 4/5    Left Hip ADduction 4+/5    Right/Left Knee Right;Left    Right Knee Flexion 4+/5    Right Knee  Extension 4+/5  Left Knee Flexion 4/5    Left Knee Extension 4/5    Right/Left Ankle Right;Left    Right Ankle Dorsiflexion 4+/5    Right Ankle Plantar Flexion 5/5    Right Ankle Inversion 4/5    Right Ankle Eversion 4+/5    Left Ankle Dorsiflexion 4+/5    Left Ankle Plantar Flexion 2+/5    Left Ankle Inversion 4/5    Left Ankle Eversion 4+/5      Palpation   Palpation comment no TTP; increased soft tissue restriction in L QL, R proximal glutes and pififormis      Ambulation/Gait   Assistive device None    Gait Pattern Step-through pattern;Lateral hip instability;Trunk flexed    Ambulation Surface Level;Indoor    Gait velocity decreased                      Objective measurements completed on examination: See above findings.               PT Education - 03/06/20 1117    Education Details prognosis, POC, HEP- Access Code: [email or web address], edu on avoiding fear of movement and returning to daily activities to patient's tolerance while still following surgeon's precautions    Person(s) Educated Patient    Methods Explanation;Demonstration;Tactile cues;Verbal cues;Handout    Comprehension Verbalized understanding;Returned demonstration            PT Short Term Goals - 03/06/20 1126      PT SHORT TERM GOAL #1   Title Patient to be independent with initial HEP.    Time 3    Period Weeks    Status New    Target Date 03/27/20             PT Long Term Goals - 03/06/20 1126      PT LONG TERM GOAL #1   Title Patient to be independent with advanced HEP.    Time 7    Period Weeks    Status New    Target Date 04/24/20      PT LONG TERM GOAL #2   Title Patient to demonstrate L shoulder ankle DF 20 degrees and plantarflexion 60 degrees to improve gait mechanics.    Time 7    Period Weeks    Status New    Target Date 04/24/20      PT LONG TERM GOAL #3   Title Patient to demonstrate B LE strength >=4+/5.    Time 7    Period Weeks    Status New     Target Date 04/24/20      PT LONG TERM GOAL #4   Title Patient to demonstrate lumbar AROM with mild limitation remaining and no pain.    Time 7    Period Weeks    Status New    Target Date 04/24/20      PT LONG TERM GOAL #5   Title Patient to report tolerance of a 30 minute walk to walk her dog without pain limiting.    Time 7    Period Weeks    Status New    Target Date 04/24/20                  Plan - 03/06/20 1119    Clinical Impression Statement Patient is a 68y/o F presenting to OPPT with c/o LBP and weakness s/p L3-4 and L4-5 PLIF on 09/26/19. Patient notes experiencing L plantarflexion weakness and L foot N/T prior to surgery, which she  still struggles with now. Now also experiencing intermittent pain down the R posterior LE and pain over the L LB. Worse with lumbar flexion and L sidebending. Patient reports that she has been significantly inactive since her surgery d/t post-op precautions, and would like to return to working out in order to lose weight. Patient today presenting with forward head and flexed posture, decreased L ankle AROM, decreased and painful lumbar AROM, L LE weakness, increased soft tissue restriction in L QL, R proximal glutes and piriformis, and gait deviations. Patient was educated on gentle stretching and strengthening HEP- patient reported understanding. Would benefit from skilled PT services 1x/week for 6 weeks to address aforementioned impairments.    Personal Factors and Comorbidities Age;Comorbidity 3+;Fitness;Past/Current Experience;Time since onset of injury/illness/exacerbation    Comorbidities PVC's. 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, mandible surgery, L knee surgery    Examination-Activity Limitations Sit;Sleep;Bed Mobility;Bend;Squat;Stairs;Carry;Stand;Transfers;Dressing;Hygiene/Grooming;Lift;Locomotion Level;Reach Overhead    Examination-Participation Restrictions  Church;Cleaning;Shop;Community Activity;Driving;Yard Work;Laundry;Meal Prep    Stability/Clinical Decision Making Stable/Uncomplicated    Rehab Potential Good    PT Frequency 2x / week    PT Duration 6 weeks    PT Treatment/Interventions ADLs/Self Care Home Management;Cryotherapy;Electrical Stimulation;Moist Heat;Balance training;Therapeutic exercise;Therapeutic activities;Functional mobility training;Stair training;Gait training;Ultrasound;Neuromuscular re-education;Patient/family education;Manual techniques;Taping;Energy conservation;Dry needling;Passive range of motion    PT Next Visit Plan lumbar FOTO; reassess HEP; progress lumbopelvic ROM and L calf strength    Consulted and Agree with Plan of Care Patient           Patient will benefit from skilled therapeutic intervention in order to improve the following deficits and impairments:  Hypomobility, Decreased activity tolerance, Decreased strength, Increased fascial restricitons, Pain, Decreased mobility, Decreased balance, Difficulty walking, Increased muscle spasms, Improper body mechanics, Decreased range of motion, Impaired flexibility, Postural dysfunction  Visit Diagnosis: Chronic left-sided low back pain, unspecified whether sciatica present - Plan: PT plan of care cert/re-cert  Muscle weakness (generalized) - Plan: PT plan of care cert/re-cert  Difficulty in walking, not elsewhere classified - Plan: PT plan of care cert/re-cert  Abnormal posture - Plan: PT plan of care cert/re-cert  Other symptoms and signs involving the musculoskeletal system - Plan: PT plan of care cert/re-cert     Problem List Patient Active Problem List   Diagnosis Date Noted   Spondylolisthesis of lumbar region 09/26/2019   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

## 2020-03-17 ENCOUNTER — Ambulatory Visit: Payer: Medicare Other

## 2020-03-17 ENCOUNTER — Other Ambulatory Visit: Payer: Self-pay

## 2020-03-17 DIAGNOSIS — R29898 Other symptoms and signs involving the musculoskeletal system: Secondary | ICD-10-CM

## 2020-03-17 DIAGNOSIS — M545 Low back pain: Secondary | ICD-10-CM | POA: Diagnosis not present

## 2020-03-17 DIAGNOSIS — M6281 Muscle weakness (generalized): Secondary | ICD-10-CM

## 2020-03-17 DIAGNOSIS — R262 Difficulty in walking, not elsewhere classified: Secondary | ICD-10-CM

## 2020-03-17 DIAGNOSIS — G8929 Other chronic pain: Secondary | ICD-10-CM

## 2020-03-17 DIAGNOSIS — R293 Abnormal posture: Secondary | ICD-10-CM

## 2020-03-17 NOTE — Therapy (Signed)
[facility] [address]  [place], [place], [address] Phone: [phone]   Fax:  [phone]  Physical Therapy Treatment  Patient Details  Name: Angela Scott MRN: [id number] Date of Birth: [birth year] Referring Provider (PT): Erline Levine, MD   Encounter Date: 03/17/2020   PT End of Session - 03/17/20 0806    Visit Number 2    Number of Visits 13    Date for PT Re-Evaluation 04/24/20   offset 1 week d/t patient being out of town   Authorization Type Medicare & [employer]    PT Start Time 0802    PT Stop Time [phone]    PT Time Calculation (min) 45 min    Activity Tolerance Patient tolerated treatment well;Patient limited by pain    Behavior During Therapy [facility] for tasks assessed/performed           Past Medical History:  Diagnosis Date  . Bronchitis   . Difficult intubation    States x 2 with Lap Band 2007 at [facility] and at [facility] 06/2019; reports severe sore throat for both surgeries and coughed up blood post surgery x 1 week  . Eustachian tube dysfunction   . GERD (gastroesophageal reflux disease)   . History of Graves' disease   . Hypothyroidism   . Lupus ([place])   . PVC's (premature ventricular contractions)    Likely outflow tract origin, LVEF normal    Past Surgical History:  Procedure Laterality Date  . KNEE SURGERY Left   . LAPAROSCOPIC GASTRIC BANDING    . LAPAROSCOPIC GASTRIC BANDING WITH HIATAL HERNIA REPAIR    . LUMBAR LAMINECTOMY    . MANDIBLE SURGERY    . MASS EXCISION Left 01/01/2015   Procedure: EXCISION MASS LEFT 1ST WEB SPACE;  Surgeon: Daryll Brod, MD;  Location: [place];  Service: Orthopedics;  Laterality: Left;  . TONSILLECTOMY      There were no vitals filed for this visit.   Subjective Assessment - 03/17/20 0805    Subjective Pt. reporting benefit from home stretches.    Pertinent History PVC's. hypothyroidism, Grave's disease, SLE, GERD,  eustachian tube dysfunction, L 1st web space mass excision, mandible surgery, L knee surgery    Diagnostic tests none recent    Patient Stated Goals work on strength, "be able to walk my dog"    Currently in Pain? Yes    Pain Score 5     Pain Location Back    Pain Orientation Left;Medial    Pain Descriptors / Indicators Tightness    Pain Type Chronic pain    Multiple Pain Sites No              [facility] PT Assessment - 03/17/20 0001      Observation/Other Assessments   Focus on Therapeutic Outcomes (FOTO)  61% (39% limitation)                         [facility] Adult PT Treatment/Exercise - 03/17/20 0001      Lumbar Exercises: Stretches   Passive Hamstring Stretch Right;Left;1 rep;30 seconds    Passive Hamstring Stretch Limitations sitting hip hinge    Quadruped Mid Back Stretch 2 reps;30 seconds    Quadruped Mid Back Stretch Limitations prone childs pose     Piriformis Stretch Right;Left;2 reps;30 seconds    Piriformis Stretch Limitations KTOS sitting and supine     Figure 4 Stretch 2 reps;30 seconds  Figure 4 Stretch Limitations B sitting     Gastroc Stretch Right;Left;1 rep;30 seconds    Gastroc Stretch Limitations at wall       Lumbar Exercises: Aerobic   Nustep Lvl 4, 6 min (UE/LE)      Lumbar Exercises: Standing   Row Both;15 reps;Strengthening    Theraband Level (Row) Level 2 (Red)    Row Limitations minor cueing for scapular retraction       Lumbar Exercises: Quadruped   Madcat/Old Horse 10 reps    Madcat/Old Horse Limitations well tolerated       Knee/Hip Exercises: Standing   Heel Raises 2 sets;Both;10 reps    Heel Raises Limitations at window ledge                   PT Education - 03/17/20 0854    Education Details HEP update; childs pose, red TB row    Person(s) Educated Patient    Methods Explanation;Demonstration;Verbal cues;Handout    Comprehension Verbalized understanding;Returned demonstration;Verbal cues required             PT Short Term Goals - 03/17/20 0806      PT SHORT TERM GOAL #1   Title Patient to be independent with initial HEP.    Time 3    Period Weeks    Status Achieved    Target Date 03/27/20             PT Long Term Goals - 03/17/20 0806      PT LONG TERM GOAL #1   Title Patient to be independent with advanced HEP.    Time 7    Period Weeks    Status On-going      PT LONG TERM GOAL #2   Title Patient to demonstrate L shoulder ankle DF 20 degrees and plantarflexion 60 degrees to improve gait mechanics.    Time 7    Period Weeks    Status On-going      PT LONG TERM GOAL #3   Title Patient to demonstrate B LE strength >=4+/5.    Time 7    Period Weeks    Status On-going      PT LONG TERM GOAL #4   Title Patient to demonstrate lumbar AROM with mild limitation remaining and no pain.    Time 7    Period Weeks    Status On-going      PT LONG TERM GOAL #5   Title Patient to report tolerance of a 30 minute walk to walk her dog without pain limiting.    Time 7    Period Weeks    Status On-going                 Plan - 03/17/20 8616    Clinical Impression Statement Pt. noting benefit from HEP stretches and notes consistent performance.  STG #1 met.  Pt. wishes to get back to swimming and walking dogs in neighborhood for community fitness.  Demonstrated good understanding of HEP technique with review today and tolerated addition of mid/upper back strengthening activities well today thus HEP updated (see pt. education section).  Ended visit pain free thus modalities deferred.    Comorbidities PVC's. 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, mandible surgery, L knee surgery    Rehab Potential Good    PT Treatment/Interventions ADLs/Self Care Home Management;Cryotherapy;Electrical Stimulation;Moist Heat;Balance training;Therapeutic exercise;Therapeutic activities;Functional mobility training;Stair training;Gait  training;Ultrasound;Neuromuscular re-education;Patient/family education;Manual techniques;Taping;Energy conservation;Dry needling;Passive range  of motion    PT Next Visit Plan Progress lumbopelvic ROM and L calf strength    Consulted and Agree with Plan of Care Patient           Patient will benefit from skilled therapeutic intervention in order to improve the following deficits and impairments:  Hypomobility, Decreased activity tolerance, Decreased strength, Increased fascial restricitons, Pain, Decreased mobility, Decreased balance, Difficulty walking, Increased muscle spasms, Improper body mechanics, Decreased range of motion, Impaired flexibility, Postural dysfunction  Visit Diagnosis: Chronic left-sided low back pain, unspecified whether sciatica present  Muscle weakness (generalized)  Difficulty in walking, not elsewhere classified  Abnormal posture  Other symptoms and signs involving the musculoskeletal system     Problem List Patient Active Problem List   Diagnosis Date Noted  . Spondylolisthesis of lumbar region 09/26/2019  . 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 Hypothyroidism 02/05/2010  . GERD 02/05/2010  . FURUNCLE 02/05/2010  . SYSTEMIC LUPUS ERYTHEMATOSUS 02/05/2010    Bess Harvest, PTA 03/17/20 9:01 AM  [facility] [address]  [place], [place], [address] Phone: [phone]   Fax:  [phone]  Name: Angela Scott MRN: [id number] Date of Birth: 25-Sep-1950

## 2020-03-24 ENCOUNTER — Ambulatory Visit: Payer: Medicare Other | Admitting: Physical Therapy

## 2020-03-27 ENCOUNTER — Ambulatory Visit: Payer: Medicare Other

## 2020-03-27 ENCOUNTER — Other Ambulatory Visit: Payer: Self-pay

## 2020-03-27 DIAGNOSIS — R262 Difficulty in walking, not elsewhere classified: Secondary | ICD-10-CM

## 2020-03-27 DIAGNOSIS — R293 Abnormal posture: Secondary | ICD-10-CM

## 2020-03-27 DIAGNOSIS — G8929 Other chronic pain: Secondary | ICD-10-CM

## 2020-03-27 DIAGNOSIS — R29898 Other symptoms and signs involving the musculoskeletal system: Secondary | ICD-10-CM

## 2020-03-27 DIAGNOSIS — M6281 Muscle weakness (generalized): Secondary | ICD-10-CM

## 2020-03-27 DIAGNOSIS — M545 Low back pain, unspecified: Secondary | ICD-10-CM

## 2020-03-27 NOTE — Therapy (Signed)
[facility] Outpatient Rehabilitation [facility] [address]  [address] [place], [place], [address] Phone: [phone]   Fax:  [phone]  Physical Therapy Treatment  Patient Details  Name: Angela Scott MRN: [id number] Date of Birth: March 01, 1951 Referring Provider (PT): Maeola Harman, MD   Encounter Date: 03/27/2020   PT End of Session - 03/27/20 0833    Visit Number 3    Number of Visits 13    Date for PT Re-Evaluation 04/24/20   offset 1 week d/t patient being out of town   Authorization Type Medicare & [employer]    PT Start Time [phone]    PT Stop Time [phone]    PT Time Calculation (min) 41 min    Activity Tolerance Patient tolerated treatment well;Patient limited by pain    Behavior During Therapy [facility] for tasks assessed/performed           Past Medical History:  Diagnosis Date  . Bronchitis   . Difficult intubation    States x 2 with Lap Band 2007 at [facility] and at [facility] 06/2019; reports severe sore throat for both surgeries and coughed up blood post surgery x 1 week  . Eustachian tube dysfunction   . GERD (gastroesophageal reflux disease)   . History of Graves' disease   . Hypothyroidism   . Lupus (HCC)   . PVC's (premature ventricular contractions)    Likely outflow tract origin, LVEF normal    Past Surgical History:  Procedure Laterality Date  . KNEE SURGERY Left   . LAPAROSCOPIC GASTRIC BANDING    . LAPAROSCOPIC GASTRIC BANDING WITH HIATAL HERNIA REPAIR    . LUMBAR LAMINECTOMY    . MANDIBLE SURGERY    . MASS EXCISION Left 01/01/2015   Procedure: EXCISION MASS LEFT 1ST WEB SPACE;  Surgeon: Cindee Salt, MD;  Location: [facility];  Service: Orthopedics;  Laterality: Left;  . TONSILLECTOMY      There were no vitals filed for this visit.   Subjective Assessment - 03/27/20 0832    Subjective Pt. doing well.  Would like to review a Pelvic Floor exercise booklet today.    Pertinent History PVC's. 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 No/denies    Pain Score 0-No pain    Pain Location Back                             [facility] Adult PT Treatment/Exercise - 03/27/20 0001      Lumbar Exercises: Stretches   Passive Hamstring Stretch Right;Left;1 rep;30 seconds    Passive Hamstring Stretch Limitations supine with strap     Quadruped Mid Back Stretch 2 reps;30 seconds    Quadruped Mid Back Stretch Limitations prone childs pose     Piriformis Stretch Right;Left;2 reps;30 seconds    Piriformis Stretch Limitations KTOS sitting and supine     Gastroc Stretch Right;Left;1 rep;30 seconds    Gastroc Stretch Limitations at wall       Lumbar Exercises: Aerobic   Nustep Lvl 4, 6 min (UE/LE)      Lumbar Exercises: Standing   Functional Squats 10 reps;3 seconds   from patients pelvic floor program - program without hold su  Functional Squats Limitations counter support     Shoulder Extension Both;10 reps;Strengthening   cues for alignment and proper scap. retraction/ depression   Other Standing Lumbar Exercises B shoulder horizontal abduction 3" x 10 reps   from patients pelvic floor program     Lumbar Exercises: Seated   Other Seated Lumbar Exercises seated pelvic floor holds 5" x 10   from patients pelvic floor program   Other Seated Lumbar Exercises Seated March + pelvic floor holds 5" x 10 reps   from patients pelvic floor program     Lumbar Exercises: Quadruped   Madcat/Old Horse 10 reps    Madcat/Old Horse Limitations well tolerated     Straight Leg Raise 10 reps;2 seconds   from patients pelvic floor program   Straight Leg Raises Limitations cues for alignment     Other Quadruped Lumbar Exercises Quadruped pelvic floor holds 3" x 10    from patients pelvic floor  program                   PT Short Term Goals - 03/17/20 0806      PT SHORT TERM GOAL #1   Title Patient to be independent with initial HEP.    Time 3    Period Weeks    Status Achieved    Target Date 03/27/20             PT Long Term Goals - 03/17/20 0806      PT LONG TERM GOAL #1   Title Patient to be independent with advanced HEP.    Time 7    Period Weeks    Status On-going      PT LONG TERM GOAL #2   Title Patient to demonstrate L shoulder ankle DF 20 degrees and plantarflexion 60 degrees to improve gait mechanics.    Time 7    Period Weeks    Status On-going      PT LONG TERM GOAL #3   Title Patient to demonstrate B LE strength >=4+/5.    Time 7    Period Weeks    Status On-going      PT LONG TERM GOAL #4   Title Patient to demonstrate lumbar AROM with mild limitation remaining and no pain.    Time 7    Period Weeks    Status On-going      PT LONG TERM GOAL #5   Title Patient to report tolerance of a 30 minute walk to walk her dog without pain limiting.    Time 7    Period Weeks    Status On-going                 Plan - 03/27/20 0834    Clinical Impression Statement Angela Scott bringing Pelvic Floor program handout in with her that she purchased.  Requested to review this program with patient to supplement HEP she is already doing and check for appropriateness.  Reviewed ~ 10 activities from handout and highlighted most of these for patient to perform at home per request.  Angela Scott did require cueing for alignment and hold times throughout therex.  Progressed scapular strengthening.  Patient tolerated all activities today without report of increased pain.  Will plan to review other half of patient's Pelvic Floor handout per her request in next visit.    Comorbidities PVC's. 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, mandible surgery, L knee surgery    Rehab Potential  Good    PT  Frequency 2x / week    PT Treatment/Interventions ADLs/Self Care Home Management;Cryotherapy;Electrical Stimulation;Moist Heat;Balance training;Therapeutic exercise;Therapeutic activities;Functional mobility training;Stair training;Gait training;Ultrasound;Neuromuscular re-education;Patient/family education;Manual techniques;Taping;Energy conservation;Dry needling;Passive range of motion    PT Next Visit Plan Progress lumbopelvic ROM and L calf strength    Consulted and Agree with Plan of Care Patient           Patient will benefit from skilled therapeutic intervention in order to improve the following deficits and impairments:  Hypomobility, Decreased activity tolerance, Decreased strength, Increased fascial restricitons, Pain, Decreased mobility, Decreased balance, Difficulty walking, Increased muscle spasms, Improper body mechanics, Decreased range of motion, Impaired flexibility, Postural dysfunction  Visit Diagnosis: Chronic left-sided low back pain, unspecified whether sciatica present  Muscle weakness (generalized)  Difficulty in walking, not elsewhere classified  Abnormal posture  Other symptoms and signs involving the musculoskeletal system     Problem List Patient Active Problem List   Diagnosis Date Noted  . Spondylolisthesis of lumbar region 09/26/2019  . 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 Hypothyroidism 02/05/2010  . GERD 02/05/2010  . FURUNCLE 02/05/2010  . SYSTEMIC LUPUS ERYTHEMATOSUS 02/05/2010    Kermit Balo, PTA 03/27/20 8:51 AM   [facility] [address]  [address] [place], [place], [address] Phone: [phone]   Fax:  [phone]  Name: Angela Scott MRN: [id number] Date of Birth: 1950-12-13

## 2020-03-31 ENCOUNTER — Ambulatory Visit: Payer: Medicare Other

## 2020-03-31 ENCOUNTER — Other Ambulatory Visit: Payer: Self-pay

## 2020-03-31 DIAGNOSIS — R29898 Other symptoms and signs involving the musculoskeletal system: Secondary | ICD-10-CM

## 2020-03-31 DIAGNOSIS — M6281 Muscle weakness (generalized): Secondary | ICD-10-CM

## 2020-03-31 DIAGNOSIS — R293 Abnormal posture: Secondary | ICD-10-CM

## 2020-03-31 DIAGNOSIS — G8929 Other chronic pain: Secondary | ICD-10-CM

## 2020-03-31 DIAGNOSIS — M545 Low back pain: Secondary | ICD-10-CM | POA: Diagnosis not present

## 2020-03-31 DIAGNOSIS — R262 Difficulty in walking, not elsewhere classified: Secondary | ICD-10-CM

## 2020-03-31 NOTE — Therapy (Signed)
[facility] Outpatient Rehabilitation [facility] [address]  [address] [place], [place], [address] Phone: [phone]   Fax:  [phone]  Physical Therapy Treatment  Patient Details  Name: Angela Scott MRN: [id number] Date of Birth: Oct 09, 1950 Referring Provider (PT): Maeola Harman, MD   Encounter Date: 03/31/2020   PT End of Session - 03/31/20 0814    Visit Number 4    Number of Visits 13    Date for PT Re-Evaluation 04/24/20   offset 1 week d/t patient being out of town   Authorization Type Medicare & [employer]    PT Start Time [phone]   Pt. arrived late to session   PT Stop Time 0844    PT Time Calculation (min) 34 min    Activity Tolerance Patient tolerated treatment well;Patient limited by pain    Behavior During Therapy [facility] for tasks assessed/performed           Past Medical History:  Diagnosis Date  . Bronchitis   . Difficult intubation    States x 2 with Lap Band 2007 at [facility] and at [facility] 06/2019; reports severe sore throat for both surgeries and coughed up blood post surgery x 1 week  . Eustachian tube dysfunction   . GERD (gastroesophageal reflux disease)   . History of Graves' disease   . Hypothyroidism   . Lupus (HCC)   . PVC's (premature ventricular contractions)    Likely outflow tract origin, LVEF normal    Past Surgical History:  Procedure Laterality Date  . KNEE SURGERY Left   . LAPAROSCOPIC GASTRIC BANDING    . LAPAROSCOPIC GASTRIC BANDING WITH HIATAL HERNIA REPAIR    . LUMBAR LAMINECTOMY    . MANDIBLE SURGERY    . MASS EXCISION Left 01/01/2015   Procedure: EXCISION MASS LEFT 1ST WEB SPACE;  Surgeon: Cindee Salt, MD;  Location: [facility];  Service: Orthopedics;  Laterality: Left;  . TONSILLECTOMY      There were no vitals filed for this visit.   Subjective Assessment - 03/31/20 0814    Subjective Moved stepping stones yesterday with some back soreness last night.    Pertinent History PVC's.  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 Yes    Pain Score 3     Pain Location Back    Pain Orientation Left;Lower;Medial    Pain Descriptors / Indicators Tightness    Pain Type Chronic pain    Multiple Pain Sites No                             [facility] Adult PT Treatment/Exercise - 03/31/20 0001      Self-Care   Self-Care Other Self-Care Comments    Other Self-Care Comments  reviewed PELVIC FLOOR yoga program that patients wishes to perform at home to adjust for appropriate level of activities      Lumbar Exercises: Stretches   Double Knee to Chest Stretch 2 reps;20 seconds    Lower Trunk Rotation Limitations 5" x 10 reps     Prone on Elbows Stretch Limitations 5" x 10 - half prone press ups    from patients yogo pelvic floor handout - pain free  Lumbar Exercises: Aerobic   Nustep Lvl 4, 6 min (UE/LE)      Lumbar Exercises: Standing   Functional Squats 10 reps;3 seconds    Functional Squats Limitations counter support       Lumbar Exercises: Supine   Dead Bug 10 reps;3 seconds    Dead Bug Limitations LE/UE raise                     PT Short Term Goals - 03/17/20 0806      PT SHORT TERM GOAL #1   Title Patient to be independent with initial HEP.    Time 3    Period Weeks    Status Achieved    Target Date 03/27/20             PT Long Term Goals - 03/17/20 0806      PT LONG TERM GOAL #1   Title Patient to be independent with advanced HEP.    Time 7    Period Weeks    Status On-going      PT LONG TERM GOAL #2   Title Patient to demonstrate L shoulder ankle DF 20 degrees and plantarflexion 60 degrees to improve gait mechanics.    Time 7    Period Weeks    Status On-going      PT LONG TERM GOAL #3   Title Patient to  demonstrate B LE strength >=4+/5.    Time 7    Period Weeks    Status On-going      PT LONG TERM GOAL #4   Title Patient to demonstrate lumbar AROM with mild limitation remaining and no pain.    Time 7    Period Weeks    Status On-going      PT LONG TERM GOAL #5   Title Patient to report tolerance of a 30 minute walk to walk her dog without pain limiting.    Time 7    Period Weeks    Status On-going                 Plan - 03/31/20 0815    Clinical Impression Statement Pt. arrived late to session thus tx time limited.  Session focused on review/demo of patients pelvic floor yoga handout program that she requested to check.  Highlighted exercises focused on lumbopelvic stability/ROM which were appropriate as handout included some advanced exercises patient unable to perform under control.  Patient continues to perform PT HEP in addition to this handout and expressed that she was happy to be able to move steppingstones and pull weeds since last visit without excessive soreness.  Patient free of LBP today with exception of short-lasting pain when turning over from supine.  Seems to be progressing well toward goals.    Comorbidities PVC's. 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, mandible surgery, L knee surgery    Rehab Potential Good    PT Frequency 2x / week    PT Treatment/Interventions ADLs/Self Care Home Management;Cryotherapy;Electrical Stimulation;Moist Heat;Balance training;Therapeutic exercise;Therapeutic activities;Functional mobility training;Stair training;Gait training;Ultrasound;Neuromuscular re-education;Patient/family education;Manual techniques;Taping;Energy conservation;Dry needling;Passive range of motion    PT Next Visit Plan Progress lumbopelvic ROM and L calf strength    Consulted and Agree with Plan of Care Patient           Patient will benefit from skilled therapeutic intervention in order to  improve the following deficits and impairments:  Hypomobility, Decreased activity tolerance, Decreased strength, Increased  fascial restricitons, Pain, Decreased mobility, Decreased balance, Difficulty walking, Increased muscle spasms, Improper body mechanics, Decreased range of motion, Impaired flexibility, Postural dysfunction  Visit Diagnosis: Chronic left-sided low back pain, unspecified whether sciatica present  Muscle weakness (generalized)  Difficulty in walking, not elsewhere classified  Abnormal posture  Other symptoms and signs involving the musculoskeletal system     Problem List Patient Active Problem List   Diagnosis Date Noted  . Spondylolisthesis of lumbar region 09/26/2019  . 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 Hypothyroidism 02/05/2010  . GERD 02/05/2010  . FURUNCLE 02/05/2010  . SYSTEMIC LUPUS ERYTHEMATOSUS 02/05/2010    Kermit Balo, PTA 03/31/20 1:15 PM   [facility] Out[facility] Rehabilitation [facility] [address]  [address] [place], [place], [address] Phone: [phone]   Fax:  [phone]  Name: Angela Scott MRN: [id number] Date of Birth: March 11, 1951

## 2020-04-03 ENCOUNTER — Other Ambulatory Visit: Payer: Self-pay

## 2020-04-03 ENCOUNTER — Ambulatory Visit: Payer: Medicare Other

## 2020-04-03 DIAGNOSIS — M545 Low back pain, unspecified: Secondary | ICD-10-CM

## 2020-04-03 DIAGNOSIS — R29898 Other symptoms and signs involving the musculoskeletal system: Secondary | ICD-10-CM

## 2020-04-03 DIAGNOSIS — R293 Abnormal posture: Secondary | ICD-10-CM

## 2020-04-03 DIAGNOSIS — M6281 Muscle weakness (generalized): Secondary | ICD-10-CM

## 2020-04-03 DIAGNOSIS — R262 Difficulty in walking, not elsewhere classified: Secondary | ICD-10-CM

## 2020-04-03 NOTE — Therapy (Addendum)
[facility] Outpatient Rehabilitation [facility] [address]  [address] [place], [place], [address] Phone: [phone]   Fax:  [phone]  Physical Therapy Treatment  Patient Details  Name: Angela Scott MRN: [id number] Date of Birth: [birth year] Referring Provider (PT): Maeola Harman, MD   Encounter Date: 04/03/2020   PT End of Session - 04/03/20 0802    Visit Number 5    Number of Visits 13    Date for PT Re-Evaluation 04/24/20   offset 1 week d/t patient being out of town   Authorization Type Medicare & [employer]    PT Start Time [phone]    PT Stop Time [phone]    PT Time Calculation (min) 39 min    Activity Tolerance Patient tolerated treatment well;Patient limited by pain    Behavior During Therapy [facility] for tasks assessed/performed           Past Medical History:  Diagnosis Date  . Bronchitis   . Difficult intubation    States x 2 with Lap Band 2007 at [facility] and at [facility] 06/2019; reports severe sore throat for both surgeries and coughed up blood post surgery x 1 week  . Eustachian tube dysfunction   . GERD (gastroesophageal reflux disease)   . History of Graves' disease   . Hypothyroidism   . Lupus (HCC)   . PVC's (premature ventricular contractions)    Likely outflow tract origin, LVEF normal    Past Surgical History:  Procedure Laterality Date  . KNEE SURGERY Left   . LAPAROSCOPIC GASTRIC BANDING    . LAPAROSCOPIC GASTRIC BANDING WITH HIATAL HERNIA REPAIR    . LUMBAR LAMINECTOMY    . MANDIBLE SURGERY    . MASS EXCISION Left 01/01/2015   Procedure: EXCISION MASS LEFT 1ST WEB SPACE;  Surgeon: Cindee Salt, MD;  Location: [facility];  Service: Orthopedics;  Laterality: Left;  . TONSILLECTOMY      There were no vitals filed for this visit.   Subjective Assessment - 04/03/20 0801    Subjective Doing ok.    Pertinent History PVC's. hypothyroidism, Grave's disease, SLE, GERD, eustachian tube dysfunction, L 1st web  space mass excision, mandible surgery, L knee surgery    Diagnostic tests none recent    Patient Stated Goals work on strength, "be able to walk my dog"    Currently in Pain? No/denies    Pain Score 0-No pain    Pain Location Back    Pain Orientation Lower    Multiple Pain Sites No              [facility] PT Assessment - 04/03/20 0001      Strength   Strength Assessment Site Hip    Right/Left Hip Right;Left    Right Hip ABduction 4+/5    Left Hip ABduction 4/5                         [facility] Adult PT Treatment/Exercise - 04/03/20 0001      Self-Care   Self-Care Other Self-Care Comments    Other Self-Care Comments  reviewed and highlighted remainder of patients PELVIC FLOOR YOGA program to check for appropriateness; patient verbalizing understanding of highlighted exercises      Lumbar Exercises: Stretches   Lower Trunk Rotation Limitations 5" x 10 reps       Lumbar Exercises: Aerobic   Nustep Lvl 4, 6 min (UE/LE)      Lumbar Exercises:  Supine   Bridge with clamshell 10 reps;3 seconds    Bridge with [employer] Limitations + B hip abd/ER into red looped TB at knees       Knee/Hip Exercises: Standing   Heel Raises 15 reps;2 seconds    Heel Raises Limitations heel/toe raise     Forward Step Up Right;Left;5 reps;1 set    Forward Step Up Limitations 8" step      Knee/Hip Exercises: Sidelying   Hip ABduction Right;Left;10 reps;Strengthening    Hip ABduction Limitations cues for slight extension                   PT Education - 04/03/20 1204    Education Details HEP update; bridge/abduction with red TB, clam shell, hip abduction sidelying    Person(s) Educated Patient    Methods Explanation;Demonstration;Verbal cues;Handout    Comprehension Verbalized understanding;Returned demonstration;Verbal cues required            PT Short Term Goals - 03/17/20 0806      PT SHORT TERM GOAL #1   Title Patient to be independent with initial HEP.    Time 3     Period Weeks    Status Achieved    Target Date 03/27/20             PT Long Term Goals - 03/17/20 0806      PT LONG TERM GOAL #1   Title Patient to be independent with advanced HEP.    Time 7    Period Weeks    Status On-going      PT LONG TERM GOAL #2   Title Patient to demonstrate L shoulder ankle DF 20 degrees and plantarflexion 60 degrees to improve gait mechanics.    Time 7    Period Weeks    Status On-going      PT LONG TERM GOAL #3   Title Patient to demonstrate B LE strength >=4+/5.    Time 7    Period Weeks    Status On-going      PT LONG TERM GOAL #4   Title Patient to demonstrate lumbar AROM with mild limitation remaining and no pain.    Time 7    Period Weeks    Status On-going      PT LONG TERM GOAL #5   Title Patient to report tolerance of a 30 minute walk to walk her dog without pain limiting.    Time 7    Period Weeks    Status On-going                 Plan - 04/03/20 0802    Clinical Impression Statement Angela Scott doing well to start session and denies pain.  was able to perform all lumbopelvic and focused lateral hip strengthening activities without increased pain.  Does have central lower back "clicking" sensations which are not painful intermittently in session.  Initiated stair straining today with visible remaining hip abductor weakness which was supported by hip strength testing with MMT.  Ended visit with HEP updated to address strength deficits.    Comorbidities PVC's. 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, mandible surgery, L knee surgery    Rehab Potential Good    PT Frequency 2x / week    PT Treatment/Interventions ADLs/Self Care Home Management;Cryotherapy;Electrical Stimulation;Moist Heat;Balance training;Therapeutic exercise;Therapeutic activities;Functional mobility training;Stair training;Gait training;Ultrasound;Neuromuscular re-education;Patient/family  education;Manual techniques;Taping;Energy conservation;Dry needling;Passive range of motion    PT Next Visit Plan  Progress lumbopelvic ROM and L calf strength    Consulted and Agree with Plan of Care Patient           Patient will benefit from skilled therapeutic intervention in order to improve the following deficits and impairments:  Hypomobility, Decreased activity tolerance, Decreased strength, Increased fascial restricitons, Pain, Decreased mobility, Decreased balance, Difficulty walking, Increased muscle spasms, Improper body mechanics, Decreased range of motion, Impaired flexibility, Postural dysfunction  Visit Diagnosis: Chronic left-sided low back pain, unspecified whether sciatica present  Muscle weakness (generalized)  Difficulty in walking, not elsewhere classified  Abnormal posture  Other symptoms and signs involving the musculoskeletal system     Problem List Patient Active Problem List   Diagnosis Date Noted  . Spondylolisthesis of lumbar region 09/26/2019  . 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 Hypothyroidism 02/05/2010  . GERD 02/05/2010  . FURUNCLE 02/05/2010  . SYSTEMIC LUPUS ERYTHEMATOSUS 02/05/2010    Kermit Balo, PTA 04/03/20 12:05 PM    [facility] Out[facility] [address]  [address] [place], [place], [address] Phone: [phone]   Fax:  [phone]  Name: Angela Scott MRN: [id number] Date of Birth: March 04, 1951

## 2020-04-07 ENCOUNTER — Telehealth: Payer: Self-pay | Admitting: Cardiology

## 2020-04-07 ENCOUNTER — Other Ambulatory Visit: Payer: Self-pay

## 2020-04-07 ENCOUNTER — Ambulatory Visit: Payer: Medicare Other | Attending: Neurosurgery

## 2020-04-07 DIAGNOSIS — M6281 Muscle weakness (generalized): Secondary | ICD-10-CM | POA: Insufficient documentation

## 2020-04-07 DIAGNOSIS — R29898 Other symptoms and signs involving the musculoskeletal system: Secondary | ICD-10-CM | POA: Insufficient documentation

## 2020-04-07 DIAGNOSIS — M545 Low back pain, unspecified: Secondary | ICD-10-CM

## 2020-04-07 DIAGNOSIS — R293 Abnormal posture: Secondary | ICD-10-CM | POA: Diagnosis present

## 2020-04-07 DIAGNOSIS — G8929 Other chronic pain: Secondary | ICD-10-CM | POA: Insufficient documentation

## 2020-04-07 DIAGNOSIS — R262 Difficulty in walking, not elsewhere classified: Secondary | ICD-10-CM | POA: Diagnosis present

## 2020-04-07 NOTE — Therapy (Signed)
[facility] [address]  [address] [place], [place], [address] Phone: [phone]   Fax:  [phone]  Physical Therapy Treatment  Patient Details  Name: Angela Scott MRN: [id number] Date of Birth: 1951/04/25 Referring Provider (PT): Maeola Harman, MD   Encounter Date: 04/07/2020   PT End of Session - 04/07/20 0807    Visit Number 6    Number of Visits 13    Date for PT Re-Evaluation 04/24/20   offset 1 week d/t patient being out of town   Authorization Type Medicare & [employer]    PT Start Time 0801    PT Stop Time [phone]    PT Time Calculation (min) 40 min    Activity Tolerance Patient tolerated treatment well;Patient limited by pain    Behavior During Therapy [facility] for tasks assessed/performed           Past Medical History:  Diagnosis Date  . Bronchitis   . Difficult intubation    States x 2 with Lap Band 2007 at [facility] and at [facility] 06/2019; reports severe sore throat for both surgeries and coughed up blood post surgery x 1 week  . Eustachian tube dysfunction   . GERD (gastroesophageal reflux disease)   . History of Graves' disease   . Hypothyroidism   . Lupus (HCC)   . PVC's (premature ventricular contractions)    Likely outflow tract origin, LVEF normal    Past Surgical History:  Procedure Laterality Date  . KNEE SURGERY Left   . LAPAROSCOPIC GASTRIC BANDING    . LAPAROSCOPIC GASTRIC BANDING WITH HIATAL HERNIA REPAIR    . LUMBAR LAMINECTOMY    . MANDIBLE SURGERY    . MASS EXCISION Left 01/01/2015   Procedure: EXCISION MASS LEFT 1ST WEB SPACE;  Surgeon: Cindee Salt, MD;  Location: [facility];  Service: Orthopedics;  Laterality: Left;  . TONSILLECTOMY      There were no vitals filed for this visit.   Subjective Assessment - 04/07/20 0803    Subjective Patient noting a sense of tiredness in L LE when she attempted jogging recently.    Pertinent History PVC's. 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 No/denies    Pain Score 0-No pain    Pain Location Back    Pain Orientation Lower    Pain Type Chronic pain    Multiple Pain Sites No                             [facility] Adult PT Treatment/Exercise - 04/07/20 0001      Lumbar Exercises: Stretches   Single Knee to Chest Stretch Right;Left;1 rep;30 seconds    Single Knee to Chest Stretch Limitations B    Quadruped Mid Back Stretch 2 reps;30 seconds    Quadruped Mid Back Stretch Limitations prone childs pose       Lumbar Exercises: Aerobic   Nustep Lvl 4, 6 min (UE/LE)      Lumbar Exercises: Supine   Bridge with clamshell 3 seconds;15 reps    Bridge with Ball Squeeze Limitations + B hip abd/ER into red looped TB at knees       Knee/Hip Exercises: Standing  Hip Flexion Right;Left;10 reps;Knee straight    Hip Flexion Limitations yellow looped TB at ankles     Hip Abduction Right;Left;10 reps;Knee straight;Stengthening    Abduction Limitations yellow looped TB at ankles     Hip Extension Right;Left;10 reps;Knee straight;Stengthening    Extension Limitations yellow looped TB at ankles      Knee/Hip Exercises: Sidelying   Hip ABduction Right;Left;10 reps;Strengthening    Hip ABduction Limitations cues for slight extension     Clams red TB clam shell x 10 reps                     PT Short Term Goals - 03/17/20 0806      PT SHORT TERM GOAL #1   Title Patient to be independent with initial HEP.    Time 3    Period Weeks    Status Achieved    Target Date 03/27/20             PT Long Term Goals - 03/17/20 0806      PT LONG TERM GOAL #1   Title Patient to be independent with advanced HEP.    Time 7    Period Weeks    Status On-going      PT LONG TERM  GOAL #2   Title Patient to demonstrate L shoulder ankle DF 20 degrees and plantarflexion 60 degrees to improve gait mechanics.    Time 7    Period Weeks    Status On-going      PT LONG TERM GOAL #3   Title Patient to demonstrate B LE strength >=4+/5.    Time 7    Period Weeks    Status On-going      PT LONG TERM GOAL #4   Title Patient to demonstrate lumbar AROM with mild limitation remaining and no pain.    Time 7    Period Weeks    Status On-going      PT LONG TERM GOAL #5   Title Patient to report tolerance of a 30 minute walk to walk her dog without pain limiting.    Time 7    Period Weeks    Status On-going                 Plan - 04/07/20 0813    Clinical Impression Statement Pt. noting some L LE weakness with recent trial of jogging.  Progressed proximal hip strengthening and LE strengthening without issue today.  Pt. did note she was challenged by standing strengthening activities today and asked for re-print of latest HEP handout for hip strengthening activities as she lost handout.  Ended visit pain free.    Comorbidities PVC's. 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, mandible surgery, L knee surgery    Rehab Potential Good    PT Frequency 2x / week    PT Treatment/Interventions ADLs/Self Care Home Management;Cryotherapy;Electrical Stimulation;Moist Heat;Balance training;Therapeutic exercise;Therapeutic activities;Functional mobility training;Stair training;Gait training;Ultrasound;Neuromuscular re-education;Patient/family education;Manual techniques;Taping;Energy conservation;Dry needling;Passive range of motion    PT Next Visit Plan Progress lumbopelvic ROM and L calf strength    Consulted and Agree with Plan of Care Patient           Patient will benefit from skilled therapeutic intervention in order to improve the following deficits and impairments:  Hypomobility, Decreased activity tolerance,  Decreased strength, Increased fascial restricitons, Pain, Decreased mobility, Decreased balance, Difficulty walking, Increased muscle spasms, Improper body mechanics, Decreased range of  motion, Impaired flexibility, Postural dysfunction  Visit Diagnosis: Chronic left-sided low back pain, unspecified whether sciatica present  Muscle weakness (generalized)  Difficulty in walking, not elsewhere classified  Abnormal posture  Other symptoms and signs involving the musculoskeletal system     Problem List Patient Active Problem List   Diagnosis Date Noted  . Spondylolisthesis of lumbar region 09/26/2019  . 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 Hypothyroidism 02/05/2010  . GERD 02/05/2010  . FURUNCLE 02/05/2010  . SYSTEMIC LUPUS ERYTHEMATOSUS 02/05/2010    Kermit Balo, PTA 04/07/20 12:12 PM   [facility] [address]  [address] [place], [place], [address] Phone: [phone]   Fax:  [phone]  Name: Angela Scott MRN: [id number] Date of Birth: 1950-09-13

## 2020-04-07 NOTE — Telephone Encounter (Signed)
I spoke with patient, she will investigate each D.O.

## 2020-04-07 NOTE — Telephone Encounter (Signed)
Returned call to pt. No answer, left msg to call back. °

## 2020-04-07 NOTE — Telephone Encounter (Signed)
Spoke with pt who would like Dr. Diona Browner to refer her to a PCP. Pt would like to know which provider Dr. Loreen Freud or [facility], DO. That Dr. Diona Browner suggest.

## 2020-04-07 NOTE — Telephone Encounter (Signed)
Pt states she has PMD names available for Dr.McDowell to assist her in making a decision.   Please call [phone]   Thanks renee

## 2020-04-07 NOTE — Telephone Encounter (Signed)
I am more familiar with Dr. Laury Axon, but I think that either of these choices would be reasonable for her.

## 2020-04-14 ENCOUNTER — Ambulatory Visit: Payer: Medicare Other

## 2020-04-14 ENCOUNTER — Other Ambulatory Visit: Payer: Self-pay

## 2020-04-14 DIAGNOSIS — R262 Difficulty in walking, not elsewhere classified: Secondary | ICD-10-CM

## 2020-04-14 DIAGNOSIS — M6281 Muscle weakness (generalized): Secondary | ICD-10-CM

## 2020-04-14 DIAGNOSIS — R293 Abnormal posture: Secondary | ICD-10-CM

## 2020-04-14 DIAGNOSIS — R29898 Other symptoms and signs involving the musculoskeletal system: Secondary | ICD-10-CM

## 2020-04-14 DIAGNOSIS — M545 Low back pain, unspecified: Secondary | ICD-10-CM

## 2020-04-14 NOTE — Therapy (Signed)
[facility] [address]  [address] [place], [place], [address] Phone: [phone]   Fax:  [phone]  Physical Therapy Treatment  Patient Details  Name: Angela Scott MRN: [id number] Date of Birth: [birth year] Referring Provider (PT): Maeola Harman, MD   Encounter Date: 04/14/2020   PT End of Session - 04/14/20 0817    Visit Number 7    Number of Visits 13    Date for PT Re-Evaluation 04/24/20   offset 1 week d/t patient being out of town   Authorization Type Medicare & [employer]    PT Start Time 0801    PT Stop Time 0845    PT Time Calculation (min) 44 min    Activity Tolerance Patient tolerated treatment well;Patient limited by pain    Behavior During Therapy [facility] for tasks assessed/performed           Past Medical History:  Diagnosis Date  . Bronchitis   . Difficult intubation    States x 2 with Lap Band 2007 at [facility] and at [facility] 06/2019; reports severe sore throat for both surgeries and coughed up blood post surgery x 1 week  . Eustachian tube dysfunction   . GERD (gastroesophageal reflux disease)   . History of Graves' disease   . Hypothyroidism   . Lupus (HCC)   . PVC's (premature ventricular contractions)    Likely outflow tract origin, LVEF normal    Past Surgical History:  Procedure Laterality Date  . KNEE SURGERY Left   . LAPAROSCOPIC GASTRIC BANDING    . LAPAROSCOPIC GASTRIC BANDING WITH HIATAL HERNIA REPAIR    . LUMBAR LAMINECTOMY    . MANDIBLE SURGERY    . MASS EXCISION Left 01/01/2015   Procedure: EXCISION MASS LEFT 1ST WEB SPACE;  Surgeon: Cindee Salt, MD;  Location: [facility];  Service: Orthopedics;  Laterality: Left;  . TONSILLECTOMY      There were no vitals filed for this visit.   Subjective Assessment - 04/14/20 1059    Subjective Doing ok.    Pertinent History PVC's. hypothyroidism, Grave's disease, SLE, GERD, eustachian tube dysfunction, L 1st web  space mass excision, mandible surgery, L knee surgery    Diagnostic tests none recent    Patient Stated Goals work on strength, "be able to walk my dog"    Currently in Pain? No/denies    Pain Score 0-No pain    Multiple Pain Sites No                             [facility] Adult PT Treatment/Exercise - 04/14/20 0001      Lumbar Exercises: Aerobic   Nustep Lvl 4, 6 min (UE/LE)      Lumbar Exercises: Machines for Strengthening   Cybex Knee Flexion B LEs: 25# x 10 reps    Leg Press Machine single arm low row 10# x 10    Other Lumbar Machine Exercise Machine low row 10# x 15 reps     Other Lumbar Machine Exercise Machine low row 15# x 10 reps       Lumbar Exercises: Supine   Bridge with clamshell 3 seconds;15 reps    Bridge with [employer] Limitations + B hip abd/ER into red looped TB at knees       Knee/Hip Exercises: Standing   Hip Flexion Right;Left;10 reps;Knee straight    Hip Flexion Limitations red looped TB at  forefoot     Hip Abduction Right;Left;10 reps;Knee straight;Stengthening    Abduction Limitations red looped TB at ankles     Forward Step Up Right;Left;1 set;10 reps;Step Height: 8";Hand Hold: 1    Forward Step Up Limitations 8" step    Functional Squat 10 reps;3 seconds    Functional Squat Limitations TRX                    PT Short Term Goals - 03/17/20 0806      PT SHORT TERM GOAL #1   Title Patient to be independent with initial HEP.    Time 3    Period Weeks    Status Achieved    Target Date 03/27/20             PT Long Term Goals - 03/17/20 0806      PT LONG TERM GOAL #1   Title Patient to be independent with advanced HEP.    Time 7    Period Weeks    Status On-going      PT LONG TERM GOAL #2   Title Patient to demonstrate L shoulder ankle DF 20 degrees and plantarflexion 60 degrees to improve gait mechanics.    Time 7    Period Weeks    Status On-going      PT LONG TERM GOAL #3   Title Patient to demonstrate B  LE strength >=4+/5.    Time 7    Period Weeks    Status On-going      PT LONG TERM GOAL #4   Title Patient to demonstrate lumbar AROM with mild limitation remaining and no pain.    Time 7    Period Weeks    Status On-going      PT LONG TERM GOAL #5   Title Patient to report tolerance of a 30 minute walk to walk her dog without pain limiting.    Time 7    Period Weeks    Status On-going                 Plan - 04/14/20 1100    Clinical Impression Statement Angela Scott reporting, she has been performing her pelvic floor yoga handout and PT HEP without issue.  reviewed machine strengthening activities today as pt. may be returning to local gym after finishing with PT.  Able to complete session pain free.  progress scapular strengthening for improved thoracolumbar support.    Comorbidities PVC's. 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, mandible surgery, L knee surgery    Rehab Potential Good    PT Frequency 2x / week    PT Duration 6 weeks    PT Treatment/Interventions ADLs/Self Care Home Management;Cryotherapy;Electrical Stimulation;Moist Heat;Balance training;Therapeutic exercise;Therapeutic activities;Functional mobility training;Stair training;Gait training;Ultrasound;Neuromuscular re-education;Patient/family education;Manual techniques;Taping;Energy conservation;Dry needling;Passive range of motion    PT Next Visit Plan Progress lumbopelvic ROM and L calf strength    Consulted and Agree with Plan of Care Patient           Patient will benefit from skilled therapeutic intervention in order to improve the following deficits and impairments:  Hypomobility, Decreased activity tolerance, Decreased strength, Increased fascial restricitons, Pain, Decreased mobility, Decreased balance, Difficulty walking, Increased muscle spasms, Improper body mechanics, Decreased range of motion, Impaired flexibility, Postural  dysfunction  Visit Diagnosis: Chronic left-sided low back pain, unspecified whether sciatica present  Muscle weakness (generalized)  Difficulty in walking, not elsewhere classified  Abnormal posture  Other symptoms and signs involving the musculoskeletal system     Problem List Patient Active Problem List   Diagnosis Date Noted  . Spondylolisthesis of lumbar region 09/26/2019  . 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 Hypothyroidism 02/05/2010  . GERD 02/05/2010  . FURUNCLE 02/05/2010  . SYSTEMIC LUPUS ERYTHEMATOSUS 02/05/2010    Kermit Balo, PTA 04/14/20 11:57 AM   [facility] [address]  [address] [place], [place], [address] Phone: [phone]   Fax:  [phone]  Name: Angela Scott MRN: [id number] Date of Birth: November 24, 1950

## 2020-04-17 ENCOUNTER — Encounter: Payer: Self-pay | Admitting: Physical Therapy

## 2020-04-17 ENCOUNTER — Other Ambulatory Visit: Payer: Self-pay

## 2020-04-17 ENCOUNTER — Ambulatory Visit: Payer: Medicare Other | Admitting: Physical Therapy

## 2020-04-17 DIAGNOSIS — M545 Low back pain: Secondary | ICD-10-CM | POA: Diagnosis not present

## 2020-04-17 DIAGNOSIS — R293 Abnormal posture: Secondary | ICD-10-CM

## 2020-04-17 DIAGNOSIS — M6281 Muscle weakness (generalized): Secondary | ICD-10-CM

## 2020-04-17 DIAGNOSIS — R29898 Other symptoms and signs involving the musculoskeletal system: Secondary | ICD-10-CM

## 2020-04-17 DIAGNOSIS — G8929 Other chronic pain: Secondary | ICD-10-CM

## 2020-04-17 DIAGNOSIS — R262 Difficulty in walking, not elsewhere classified: Secondary | ICD-10-CM

## 2020-04-17 NOTE — Therapy (Signed)
[facility] [address]  [address] [place], [place], [address] Phone: [phone]   Fax:  [phone]  Physical Therapy Treatment  Patient Details  Name: Angela Scott MRN: [id number] Date of Birth: January 03, 1951 Referring Provider (PT): Maeola Harman, MD   Encounter Date: 04/17/2020   PT End of Session - 04/17/20 0957    Visit Number 8    Number of Visits 13    Date for PT Re-Evaluation 04/24/20   offset 1 week d/t patient being out of town   Authorization Type Medicare & [employer]    PT Start Time 0801    PT Stop Time [phone]    PT Time Calculation (min) 43 min    Activity Tolerance Patient tolerated treatment well    Behavior During Therapy [facility] for tasks assessed/performed           Past Medical History:  Diagnosis Date   Bronchitis    Difficult intubation    States x 2 with Lap Band 2007 at [facility] and at [facility] 06/2019; reports severe sore throat for both surgeries and coughed up blood post surgery x 1 week   Eustachian tube dysfunction    GERD (gastroesophageal reflux disease)    History of Graves' disease    Hypothyroidism    Lupus (HCC)    PVC's (premature ventricular contractions)    Likely outflow tract origin, LVEF normal    Past Surgical History:  Procedure Laterality Date   KNEE SURGERY Left    LAPAROSCOPIC GASTRIC BANDING     LAPAROSCOPIC GASTRIC BANDING WITH HIATAL HERNIA REPAIR     LUMBAR LAMINECTOMY     MANDIBLE SURGERY     MASS EXCISION Left 01/01/2015   Procedure: EXCISION MASS LEFT 1ST WEB SPACE;  Surgeon: Cindee Salt, MD;  Location: [facility];  Service: Orthopedics;  Laterality: Left;   TONSILLECTOMY      There were no vitals filed for this visit.   Subjective Assessment - 04/17/20 0805    Subjective Was outside for 12 hours yesterday with intermittent standing- had some soreness in her back. Walking still seems to be difficult.    Pertinent  History PVC's. 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 No/denies                             [facility] Adult PT Treatment/Exercise - 04/17/20 0001      Exercises   Exercises Lumbar      Lumbar Exercises: Aerobic   Nustep Lvl 4, 6 min (UE/LE)      Lumbar Exercises: Prone   Other Prone Lumbar Exercises prone on elbows 2x5 to tolerance      Lumbar Exercises: Quadruped   Madcat/Old Horse 10 reps    Madcat/Old Horse Limitations limited lordosis    Other Quadruped Lumbar Exercises thread the needle 5x each side    cues to look at hand     Knee/Hip Exercises: Stretches   Gastroc Stretch Right;Left;1 rep;30 seconds    Gastroc Stretch Limitations runner's stretch    Other Knee/Hip Stretches L gastroc stretch on prostretch 2x30"      Knee/Hip Exercises: Standing   Heel Raises 2 seconds;2 sets;5  reps    Heel Raises Limitations B heel raise, L eccentric on UBE platform    Other Standing Knee Exercises L assisted heel raise x10 at lat bar 105#    Other Standing Knee Exercises monster walk anterior/posterior with red loop around ankles 6x length of counter   c/o fatigue                 PT Education - 04/17/20 0957    Education Details update to HEP; discussion on continued fitness programs/activities after PT discharge    Person(s) Educated Patient    Methods Explanation;Demonstration;Tactile cues;Verbal cues;Handout    Comprehension Verbalized understanding;Returned demonstration            PT Short Term Goals - 03/17/20 0806      PT SHORT TERM GOAL #1   Title Patient to be independent with initial HEP.    Time 3    Period Weeks    Status Achieved    Target Date 03/27/20             PT Long Term Goals - 03/17/20 0806      PT LONG  TERM GOAL #1   Title Patient to be independent with advanced HEP.    Time 7    Period Weeks    Status On-going      PT LONG TERM GOAL #2   Title Patient to demonstrate L shoulder ankle DF 20 degrees and plantarflexion 60 degrees to improve gait mechanics.    Time 7    Period Weeks    Status On-going      PT LONG TERM GOAL #3   Title Patient to demonstrate B LE strength >=4+/5.    Time 7    Period Weeks    Status On-going      PT LONG TERM GOAL #4   Title Patient to demonstrate lumbar AROM with mild limitation remaining and no pain.    Time 7    Period Weeks    Status On-going      PT LONG TERM GOAL #5   Title Patient to report tolerance of a 30 minute walk to walk her dog without pain limiting.    Time 7    Period Weeks    Status On-going                 Plan - 04/17/20 2751    Clinical Impression Statement Patient reporting prolonged sitting, standing, and walking yesterday in the heat which caused some LB soreness which was short-lasting. Still noting continued difficulty walking at a quick speed d/t weakness. Began session working on lumbopelvic mobility. Patient demonstrated limited lordosis with cat cow. Thoracolumbar rotation was limited and perform to tolerance d/t c/o sensation of cramping in the R ribcage. Encouragement provided to increase ROM with prone on elbows. Patient able to perform increased challenge with heel raises today but with report of fatigue with subsequent reps and requiring rest breaks in between. Assisted L single leg heel raises were more difficult to complete compared to modified B heel raises d/t muscle fatigue. Updated HEP with modified B heel raises for increased challenge at home. Patient reported understanding and without complaints at end of session.    Comorbidities PVC's. 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, mandible surgery, L knee surgery    Rehab Potential Good    PT  Frequency 2x / week    PT Duration 6 weeks    PT  Treatment/Interventions ADLs/Self Care Home Management;Cryotherapy;Electrical Stimulation;Moist Heat;Balance training;Therapeutic exercise;Therapeutic activities;Functional mobility training;Stair training;Gait training;Ultrasound;Neuromuscular re-education;Patient/family education;Manual techniques;Taping;Energy conservation;Dry needling;Passive range of motion    PT Next Visit Plan Progress lumbopelvic ROM and L calf strength    Consulted and Agree with Plan of Care Patient           Patient will benefit from skilled therapeutic intervention in order to improve the following deficits and impairments:  Hypomobility, Decreased activity tolerance, Decreased strength, Increased fascial restricitons, Pain, Decreased mobility, Decreased balance, Difficulty walking, Increased muscle spasms, Improper body mechanics, Decreased range of motion, Impaired flexibility, Postural dysfunction  Visit Diagnosis: Chronic left-sided low back pain, unspecified whether sciatica present  Muscle weakness (generalized)  Difficulty in walking, not elsewhere classified  Abnormal posture  Other symptoms and signs involving the musculoskeletal system     Problem List Patient Active Problem List   Diagnosis Date Noted   Spondylolisthesis of lumbar region 09/26/2019   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  [address] [place], [place], [address] Phone: [phone]   Fax:  [phone]  Name: ALEXANDER MCAULEY MRN: [id number] Date of Birth: 02/13/1951

## 2020-04-21 ENCOUNTER — Ambulatory Visit: Payer: Medicare Other

## 2020-04-21 ENCOUNTER — Other Ambulatory Visit: Payer: Self-pay

## 2020-04-21 DIAGNOSIS — R262 Difficulty in walking, not elsewhere classified: Secondary | ICD-10-CM

## 2020-04-21 DIAGNOSIS — R293 Abnormal posture: Secondary | ICD-10-CM

## 2020-04-21 DIAGNOSIS — M6281 Muscle weakness (generalized): Secondary | ICD-10-CM

## 2020-04-21 DIAGNOSIS — G8929 Other chronic pain: Secondary | ICD-10-CM

## 2020-04-21 DIAGNOSIS — R29898 Other symptoms and signs involving the musculoskeletal system: Secondary | ICD-10-CM

## 2020-04-21 DIAGNOSIS — M545 Low back pain, unspecified: Secondary | ICD-10-CM

## 2020-04-21 NOTE — Therapy (Signed)
[facility] [address]  [address] [place], [place], [address] Phone: [phone]   Fax:  [phone]  Physical Therapy Treatment  Patient Details  Name: Angela Scott MRN: [id number] Date of Birth: 01/17/1951 Referring Provider (PT): Angela Harman, MD   Encounter Date: 04/21/2020   PT End of Session - 04/21/20 0814    Visit Number 9    Number of Visits 13    Date for PT Re-Evaluation 04/24/20   offset 1 week d/t patient being out of town   Authorization Type Medicare & [employer]    PT Start Time 0802    PT Stop Time [phone]    PT Time Calculation (min) 39 min    Activity Tolerance Patient tolerated treatment well    Behavior During Therapy [facility] for tasks assessed/performed           Past Medical History:  Diagnosis Date  . Bronchitis   . Difficult intubation    States x 2 with Lap Band 2007 at [facility] and at [facility] 06/2019; reports severe sore throat for both surgeries and coughed up blood post surgery x 1 week  . Eustachian tube dysfunction   . GERD (gastroesophageal reflux disease)   . History of Graves' disease   . Hypothyroidism   . Lupus (HCC)   . PVC's (premature ventricular contractions)    Likely outflow tract origin, LVEF normal    Past Surgical History:  Procedure Laterality Date  . KNEE SURGERY Left   . LAPAROSCOPIC GASTRIC BANDING    . LAPAROSCOPIC GASTRIC BANDING WITH HIATAL HERNIA REPAIR    . LUMBAR LAMINECTOMY    . MANDIBLE SURGERY    . MASS EXCISION Left 01/01/2015   Procedure: EXCISION MASS LEFT 1ST WEB SPACE;  Surgeon: Angela Salt, MD;  Location: [facility];  Service: Orthopedics;  Laterality: Left;  . TONSILLECTOMY      There were no vitals filed for this visit.   Subjective Assessment - 04/21/20 0813    Subjective Pt. was able to walk/jog at [employer] without soreness just fatigue.    Pertinent History PVC's. 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 No/denies    Pain Score 0-No pain    Pain Location Back    Multiple Pain Sites No                             [facility] Adult PT Treatment/Exercise - 04/21/20 0001      Lumbar Exercises: Stretches   Lower Trunk Rotation Limitations 5" x 10 reps       Lumbar Exercises: Aerobic   Recumbent Bike Lvl 1, 6 min       Lumbar Exercises: Sidelying   Other Sidelying Lumbar Exercises B "open book" stretch 5" x 10 reps      Knee/Hip Exercises: Standing   Heel Raises Both;Right;Left;10 reps    Heel Raises Limitations B con/L 75% weight shift    only 75% L weight shift as pt. was 3 days sore after 8/13   Hip Flexion Right;Left;10 reps;Knee straight    Hip Flexion Limitations red looped TB at ankles    counter support    Hip  Abduction Right;Left;10 reps;Knee straight;Stengthening    Abduction Limitations red looped TB at ankles    counter support    Hip Extension Right;Left;10 reps;Knee straight;Stengthening    Extension Limitations red looped TB at ankles    counter support    Other Standing Knee Exercises Forward monster walk and side stepping with red TB at ankles 2 x 25 ft                     PT Short Term Goals - 03/17/20 0806      PT SHORT TERM GOAL #1   Title Patient to be independent with initial HEP.    Time 3    Period Weeks    Status Achieved    Target Date 03/27/20             PT Long Term Goals - 03/17/20 0806      PT LONG TERM GOAL #1   Title Patient to be independent with advanced HEP.    Time 7    Period Weeks    Status On-going      PT LONG TERM GOAL #2   Title Patient to demonstrate L shoulder ankle DF 20 degrees and plantarflexion 60 degrees to improve gait mechanics.    Time 7    Period Weeks    Status On-going        PT LONG TERM GOAL #3   Title Patient to demonstrate B LE strength >=4+/5.    Time 7    Period Weeks    Status On-going      PT LONG TERM GOAL #4   Title Patient to demonstrate lumbar AROM with mild limitation remaining and no pain.    Time 7    Period Weeks    Status On-going      PT LONG TERM GOAL #5   Title Patient to report tolerance of a 30 minute walk to walk her dog without pain limiting.    Time 7    Period Weeks    Status On-going                 Plan - 04/21/20 0816    Clinical Impression Statement [name] doing ok.  Notes she was able to walk a lot on golf tournament over weekend with only LE fatigue.  Feels she is improving with therapy however noting most difficulty with heel raise eccentric strengthening activities today.  Updated HEP as pt. reporting she may feel ready to transition to home/gym program after last visit in POC.  Pt. verbalizing she plans to speak with PT at upcoming session regarding best course of action.    Comorbidities PVC's. 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, mandible surgery, L knee surgery    Rehab Potential Good    PT Frequency 2x / week    PT Duration 6 weeks    PT Treatment/Interventions ADLs/Self Care Home Management;Cryotherapy;Electrical Stimulation;Moist Heat;Balance training;Therapeutic exercise;Therapeutic activities;Functional mobility training;Stair training;Gait training;Ultrasound;Neuromuscular re-education;Patient/family education;Manual techniques;Taping;Energy conservation;Dry needling;Passive range of motion    PT Next Visit Plan Progress lumbopelvic ROM and L calf strength    Consulted and Agree with Plan of Care Patient           Patient will benefit from skilled therapeutic intervention in order to improve the following deficits and impairments:  Hypomobility, Decreased activity tolerance, Decreased strength, Increased fascial restricitons, Pain,  Decreased mobility, Decreased balance, Difficulty walking, Increased muscle spasms, Improper body mechanics,  Decreased range of motion, Impaired flexibility, Postural dysfunction  Visit Diagnosis: Chronic left-sided low back pain, unspecified whether sciatica present  Muscle weakness (generalized)  Difficulty in walking, not elsewhere classified  Abnormal posture  Other symptoms and signs involving the musculoskeletal system     Problem List Patient Active Problem List   Diagnosis Date Noted  . Spondylolisthesis of lumbar region 09/26/2019  . 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 Hypothyroidism 02/05/2010  . GERD 02/05/2010  . FURUNCLE 02/05/2010  . SYSTEMIC LUPUS ERYTHEMATOSUS 02/05/2010    Kermit Balo, PTA 04/21/20 11:50 AM   [facility] [address]  [address] [place], [place], [address] Phone: [phone]   Fax:  [phone]  Name: Angela Scott MRN: [id number] Date of Birth: [birth year]

## 2020-04-24 ENCOUNTER — Other Ambulatory Visit: Payer: Self-pay

## 2020-04-24 ENCOUNTER — Ambulatory Visit: Payer: Medicare Other | Admitting: Physical Therapy

## 2020-04-24 ENCOUNTER — Encounter: Payer: Self-pay | Admitting: Physical Therapy

## 2020-04-24 DIAGNOSIS — M545 Low back pain, unspecified: Secondary | ICD-10-CM

## 2020-04-24 DIAGNOSIS — G8929 Other chronic pain: Secondary | ICD-10-CM

## 2020-04-24 DIAGNOSIS — R29898 Other symptoms and signs involving the musculoskeletal system: Secondary | ICD-10-CM

## 2020-04-24 DIAGNOSIS — R262 Difficulty in walking, not elsewhere classified: Secondary | ICD-10-CM

## 2020-04-24 DIAGNOSIS — R293 Abnormal posture: Secondary | ICD-10-CM

## 2020-04-24 DIAGNOSIS — M6281 Muscle weakness (generalized): Secondary | ICD-10-CM

## 2020-04-24 NOTE — Therapy (Addendum)
[facility] [address]  [place], [place], [address] Phone: [phone]   Fax:  [phone]  Physical Therapy Progress Note  Patient Details  Name: Angela Scott MRN: [id number] Date of Birth: February 16, 1951 Referring Provider (PT): Erline Levine, MD   Progress Note Reporting Period 03/06/20 to 04/24/20  See note below for Objective Data and Assessment of Progress/Goals.    Encounter Date: 04/24/2020   PT End of Session - 04/24/20 1031     Visit Number 10    Number of Visits 13    Date for PT Re-Evaluation 04/24/20   offset 1 week d/t patient being out of town   Authorization Type Medicare & [employer]    PT Start Time 0801    PT Stop Time [phone]    PT Time Calculation (min) 46 min    Activity Tolerance Patient tolerated treatment well    Behavior During Therapy [facility] for tasks assessed/performed             Past Medical History:  Diagnosis Date   Bronchitis    Difficult intubation    States x 2 with Lap Band 2007 at [facility] and at [facility] 06/2019; reports severe sore throat for both surgeries and coughed up blood post surgery x 1 week   Eustachian tube dysfunction    GERD (gastroesophageal reflux disease)    History of Graves' disease    Hypothyroidism    Lupus ([place])    PVC's (premature ventricular contractions)    Likely outflow tract origin, LVEF normal    Past Surgical History:  Procedure Laterality Date   KNEE SURGERY Left    LAPAROSCOPIC GASTRIC BANDING     LAPAROSCOPIC GASTRIC BANDING WITH HIATAL HERNIA REPAIR     LUMBAR LAMINECTOMY     MANDIBLE SURGERY     MASS EXCISION Left 01/01/2015   Procedure: EXCISION MASS LEFT 1ST WEB SPACE;  Surgeon: Daryll Brod, MD;  Location: [place];  Service: Orthopedics;  Laterality: Left;   TONSILLECTOMY      There were no vitals filed for this visit.   Subjective Assessment - 04/24/20 0802     Subjective Back was really  hurting yesterday- she was really busy yesterday. Noticing that her house is staying cleaner because she is able to do more around the house, able to walk longer, and her walk is better. Feels like she would like to take a break from PT to work on her exercises and return to the gym.    Pertinent History PVC's. 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 No/denies                [facility] PT Assessment - 04/24/20 0001       Assessment   Medical Diagnosis Dorsalgia    Referring Provider (PT) Erline Levine, MD    Onset Date/Surgical Date 09/26/19      Observation/Other Assessments   Focus on Therapeutic Outcomes (FOTO)  63% (37% limitation)      AROM   Left Ankle Dorsiflexion 16    Left Ankle Plantar Flexion 48    Lumbar Flexion floor   "stretch"   Lumbar Extension moderately limited    Lumbar - Right Side Bend distal  thigh    Lumbar - Left Side Bend mid- thigh   R buttock "needling sticking me"   Lumbar - Right Rotation mildly limited   pain in ribs   Lumbar - Left Rotation mildly limited   pain in ribs     Strength   Right Hip Flexion 4+/5    Right Hip ABduction 4+/5    Right Hip ADduction 4+/5    Left Hip Flexion 4+/5    Left Hip ABduction 4/5    Left Hip ADduction 4+/5    Right Knee Flexion 5/5    Right Knee Extension 5/5    Left Knee Flexion 4+/5    Left Knee Extension 5/5    Right Ankle Dorsiflexion 5/5    Right Ankle Plantar Flexion 5/5    Right Ankle Inversion 4+/5    Right Ankle Eversion 5/5    Left Ankle Dorsiflexion 5/5    Left Ankle Plantar Flexion 3+/5   partial ROM for 5 reps   Left Ankle Inversion 4/5    Left Ankle Eversion 4+/5                           [facility] Adult PT Treatment/Exercise - 04/24/20 0001       Lumbar  Exercises: Aerobic   Nustep Lvl 4, 6 min (UE/LE)      Lumbar Exercises: Seated   Other Seated Lumbar Exercises L ankle inversion green TB x10      Knee/Hip Exercises: Stretches   Other Knee/Hip Stretches R and L gastroc stretch at TM rail 30"                    PT Education - 04/24/20 1030     Education Details update/consolidation of HEP, discussion on objective progress and remaining impairments, encouraged patient to trying walking program without her dogs to improve walking tolerance before taking them on a walk with her    Person(s) Educated Patient    Methods Explanation;Demonstration;Tactile cues;Verbal cues;Handout    Comprehension Verbalized understanding;Returned demonstration              PT Short Term Goals - 04/24/20 1031       PT SHORT TERM GOAL #1   Title Patient to be independent with initial HEP.    Time 3    Period Weeks    Status Achieved    Target Date 03/27/20               PT Long Term Goals - 04/24/20 0827       PT LONG TERM GOAL #1   Title Patient to be independent with advanced HEP.    Time 7    Period Weeks    Status Achieved      PT LONG TERM GOAL #2   Title Patient to demonstrate L shoulder ankle DF 20 degrees and plantarflexion 60 degrees to improve gait mechanics.    Time 7    Period Weeks    Status Partially Met      PT LONG TERM GOAL #3   Title Patient to demonstrate B LE strength >=4+/5.    Time 7    Period Weeks    Status Partially Met      PT LONG TERM GOAL #4   Title Patient to demonstrate lumbar AROM with mild limitation remaining and no pain.    Time 7    Period Weeks    Status Partially Met  PT LONG TERM GOAL #5   Title Patient to report tolerance of a 30 minute walk to walk her dog without pain limiting.    Time 7    Period Weeks    Status On-going   notes that she still feels apprehensive to walk her dogs d/t fear of falling when walking both dogs                  Plan -  04/24/20 1037     Clinical Impression Statement cleaner because she is able to do more around the house, able to walk longer, and her walking pattern is better. Feels like she would like to take a break from PT to work on her exercises and return to the gym. Patient has demonstrated good improvement in LE strength, now only with L hip abduction, ankle plantarflexion and inversion weakness remaining. L ankle AROM has also improved in dorsiflexion and plantarflexion, now nearly symmetrical to opposite LE. Lumbar AROM has improved in extension and B rotation, with most limitation and discomfort in B sidebending. Patient notes that she has not attempted walking for exercise d/t fear of falling from her dogs pulling her. Encouraged patient to start walking program without her pets to improve comfort and endurance. Took time to discuss patient's final HEP and her personal yoga program to address remaining deficits. Patient has made great progress towards goals, is independent with HEP, and hopes to return to a gym regimen at this time. Patient now on 30 day hold.    Comorbidities PVC's. 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, mandible surgery, L knee surgery    Rehab Potential Good    PT Frequency 2x / week    PT Duration 6 weeks    PT Treatment/Interventions ADLs/Self Care Home Management;Cryotherapy;Electrical Stimulation;Moist Heat;Balance training;Therapeutic exercise;Therapeutic activities;Functional mobility training;Stair training;Gait training;Ultrasound;Neuromuscular re-education;Patient/family education;Manual techniques;Taping;Energy conservation;Dry needling;Passive range of motion    PT Next Visit Plan 30 day hold at this time    Consulted and Agree with Plan of Care Patient             Patient will benefit from skilled therapeutic intervention in order to improve the following deficits and impairments:  Hypomobility, Decreased  activity tolerance, Decreased strength, Increased fascial restricitons, Pain, Decreased mobility, Decreased balance, Difficulty walking, Increased muscle spasms, Improper body mechanics, Decreased range of motion, Impaired flexibility, Postural dysfunction  Visit Diagnosis: Chronic left-sided low back pain, unspecified whether sciatica present  Muscle weakness (generalized)  Difficulty in walking, not elsewhere classified  Abnormal posture  Other symptoms and signs involving the musculoskeletal system     Problem List Patient Active Problem List   Diagnosis Date Noted   Spondylolisthesis of lumbar region 09/26/2019   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  Patient goals were met. Patient is being discharged due to meeting the stated rehab goals.   Discharged updated today, to close old episode. Signing therapist did not treat patient, just doing d/c note.  Lyndee Hensen, PT, DPT 9:27 AM  07/20/22

## 2020-06-18 ENCOUNTER — Other Ambulatory Visit: Payer: Self-pay

## 2020-06-19 ENCOUNTER — Ambulatory Visit (INDEPENDENT_AMBULATORY_CARE_PROVIDER_SITE_OTHER): Payer: Medicare Other | Admitting: Family Medicine

## 2020-06-19 ENCOUNTER — Encounter: Payer: Self-pay | Admitting: Family Medicine

## 2020-06-19 VITALS — BP 146/84 | HR 74 | Temp 98.3°F | Ht 64.5 in | Wt 219.2 lb

## 2020-06-19 DIAGNOSIS — E559 Vitamin D deficiency, unspecified: Secondary | ICD-10-CM

## 2020-06-19 DIAGNOSIS — Z5320 Procedure and treatment not carried out because of patient's decision for unspecified reasons: Secondary | ICD-10-CM

## 2020-06-19 DIAGNOSIS — Z2821 Immunization not carried out because of patient refusal: Secondary | ICD-10-CM

## 2020-06-19 DIAGNOSIS — Z13820 Encounter for screening for osteoporosis: Secondary | ICD-10-CM

## 2020-06-19 DIAGNOSIS — M329 Systemic lupus erythematosus, unspecified: Secondary | ICD-10-CM | POA: Diagnosis not present

## 2020-06-19 DIAGNOSIS — Z1231 Encounter for screening mammogram for malignant neoplasm of breast: Secondary | ICD-10-CM

## 2020-06-19 DIAGNOSIS — E039 Hypothyroidism, unspecified: Secondary | ICD-10-CM

## 2020-06-19 DIAGNOSIS — Z7689 Persons encountering health services in other specified circumstances: Secondary | ICD-10-CM

## 2020-06-19 DIAGNOSIS — Z1321 Encounter for screening for nutritional disorder: Secondary | ICD-10-CM

## 2020-06-19 DIAGNOSIS — G8929 Other chronic pain: Secondary | ICD-10-CM

## 2020-06-19 DIAGNOSIS — Z13220 Encounter for screening for lipoid disorders: Secondary | ICD-10-CM

## 2020-06-19 DIAGNOSIS — M545 Low back pain, unspecified: Secondary | ICD-10-CM

## 2020-06-19 DIAGNOSIS — R7303 Prediabetes: Secondary | ICD-10-CM

## 2020-06-19 NOTE — Addendum Note (Signed)
Addended by: Varney Biles on: 06/19/2020 02:07 PM   Modules accepted: Orders

## 2020-06-19 NOTE — Progress Notes (Addendum)
Angela Scott is a 69 y.o. female  Chief Complaint  Patient presents with  . Establish Care    NP- estabblish care. c/o back stiffness due to having past back surgery,    HPI: Angela Scott is a 69 y.o. female seen today as a new patient to establish care with our office. Previous PCP - Marlyne Beards, PA in [place] She has a PMHx significant for hypothyroidism Luiz Blare' disease s/p ablation), SLE, chronic lumbar back pain s/p lumbar laminectomy (in 09/2019 with Dr. Venetia Maxon), GERD.   Specialists: Dr. Wenda Low with [facility] - did lap band surgery and also follows pts thyroid function.  [facility] - pt was taking supplements from them but stopped and is only taking probiotic, "medicine for skin" - subscription expires in 09/2019 She does not follow with rheum, derm for lupus. She states running really helped her symptoms. She has not be able to run in past 2 year d/t back pain.   Last mammo: 10 years ago - she states she was told her "mammograms were so good that she didn't need anymore". Sister diagnosed with breast cancer in 09/2019 Last Dexa: more than 5 years ago - normal  Last colonoscopy: never and pt declines  Dental: UTD Vision: wears contacts, due for exam   Past Medical History:  Diagnosis Date  . Bronchitis   . Difficult intubation    States x 2 with Lap Band 2007 [facility] and at [facility] 06/2019; reports severe sore throat for both surgeries and coughed up blood post surgery x 1 week  . Eustachian tube dysfunction   . GERD (gastroesophageal reflux disease)   . History of Graves' disease   . Hypothyroidism   . Lupus (HCC)   . PVC's (premature ventricular contractions)    Likely outflow tract origin, LVEF normal    Past Surgical History:  Procedure Laterality Date  . KNEE SURGERY Left   . LAPAROSCOPIC GASTRIC BANDING    . LAPAROSCOPIC GASTRIC BANDING WITH HIATAL HERNIA REPAIR    . LUMBAR LAMINECTOMY    . MANDIBLE SURGERY      . MASS EXCISION Left 01/01/2015   Procedure: EXCISION MASS LEFT 1ST WEB SPACE;  Surgeon: Cindee Salt, MD;  Location: [facility];  Service: Orthopedics;  Laterality: Left;  . SPINAL FUSION  09/2019  . TONSILLECTOMY      Social History   Socioeconomic History  . Marital status: Single    Spouse name: Not on file  . Number of children: Not on file  . Years of education: Not on file  . Highest education level: Not on file  Occupational History  . Not on file  Tobacco Use  . Smoking status: Current Every Day Smoker    Packs/day: 0.50    Types: Cigarettes  . Smokeless tobacco: Never Used  Vaping Use  . Vaping Use: Never used  Substance and Sexual Activity  . Alcohol use: Yes    Comment: occ  . Drug use: No  . Sexual activity: Not on file  Other Topics Concern  . Not on file  Social History Narrative  . Not on file   Social Determinants of Health   Financial Resource Strain:   . Difficulty of Paying Living Expenses: Not on file  Food Insecurity:   . Worried About Programme researcher, broadcasting/film/video in the Last Year: Not on file  . Ran Out of Food in the Last Year: Not on file  Transportation Needs:   .  Lack of Transportation (Medical): Not on file  . Lack of Transportation (Non-Medical): Not on file  Physical Activity:   . Days of Exercise per Week: Not on file  . Minutes of Exercise per Session: Not on file  Stress:   . Feeling of Stress : Not on file  Social Connections:   . Frequency of Communication with Friends and Family: Not on file  . Frequency of Social Gatherings with Friends and Family: Not on file  . Attends Religious Services: Not on file  . Active Member of Clubs or Organizations: Not on file  . Attends Banker Meetings: Not on file  . Marital Status: Not on file  Intimate Partner Violence:   . Fear of Current or Ex-Partner: Not on file  . Emotionally Abused: Not on file  . Physically Abused: Not on file  . Sexually Abused: Not on file     Family History  Problem Relation Age of Onset  . Brain cancer Father        Glioblastoma  . Arrhythmia Brother        Ablation      There is no immunization history on file for this patient.  Outpatient Encounter Medications as of 06/19/2020  Medication Sig Note  . ibuprofen (ADVIL) 400 MG tablet Take 400 mg by mouth as needed.   Marland Kitchen liothyronine (CYTOMEL) 50 MCG tablet Take 50 mcg by mouth every other day.   . Multiple Vitamin (MULTIVITAMIN WITH MINERALS) TABS tablet Take 1 tablet by mouth daily.   Marland Kitchen SYNTHROID 200 MCG tablet TAKE 1 TABLET DAILY (Patient taking differently: Take 200 mcg by mouth daily before breakfast. ) 09/20/2019: BRAND ONLY!  . [DISCONTINUED] methocarbamol (ROBAXIN) 500 MG tablet Take 1 tablet (500 mg total) by mouth every 6 (six) hours as needed for muscle spasms. (Patient not taking: Reported on 03/06/2020)    No facility-administered encounter medications on file as of 06/19/2020.     ROS: Pertinent positives and negatives noted in HPI. Remainder of ROS non-contributory    Allergies  Allergen Reactions  . Clindamycin/Lincomycin     C-Dif toxicity  . Methotrexate Derivatives Anaphylaxis    Fever,lymphadnopathy  . Augmentin [Amoxicillin-Pot Clavulanate]     N&V  . Other     absorbable sutures; Reports they do not absorb and create an abscess    BP (!) 146/84   Pulse 74   Temp 98.3 F (36.8 C) (Temporal)   Ht 5' 4.5" (1.638 m)   Wt 219 lb 3.2 oz (99.4 kg)   LMP  (LMP Unknown)   SpO2 98%   BMI 37.04 kg/m    BP Readings from Last 3 Encounters:  06/19/20 (!) 146/84  01/24/20 134/68  10/31/19 (!) 155/90   Pulse Readings from Last 3 Encounters:  06/19/20 74  01/24/20 79  10/31/19 78   Wt Readings from Last 3 Encounters:  06/19/20 219 lb 3.2 oz (99.4 kg)  01/24/20 216 lb (98 kg)  10/31/19 210 lb (95.3 kg)    Physical Exam Constitutional:      General: She is not in acute distress.    Appearance: Normal appearance. She is not  ill-appearing.  Cardiovascular:     Rate and Rhythm: Normal rate and regular rhythm.     Pulses: Normal pulses.  Pulmonary:     Effort: Pulmonary effort is normal. No respiratory distress.     Breath sounds: No stridor. No wheezing or rhonchi.  Musculoskeletal:     Right lower leg: No  edema.     Left lower leg: No edema.  Neurological:     Mental Status: She is alert and oriented to person, place, and time.  Psychiatric:        Mood and Affect: Mood normal.        Behavior: Behavior normal.      A/P:  1. Encounter to establish care with new doctor  2. Hypothyroidism, unspecified type - follows with Dr. Wenda Low ([facility])   3. Systemic lupus erythematosus, unspecified SLE type, unspecified organ involvement status (HCC) - well-controlled, no meds - does not follow with rheum, derm  4. Influenza vaccination declined by patient  5. COVID-19 vaccination declined -  Declines d/t exacerbation of lupus after previous flu vaccine  6. Chronic bilateral low back pain without sciatica - s/p laminectomy - follows with neurosurgery (Dr. Venetia Maxon)  7. Screening for osteoporosis - DG Bone Density; Future  8. Encounter for screening mammogram for malignant neoplasm of breast - MM DIGITAL SCREENING BILATERAL; Future  9. Colon cancer screening declined  10. Prediabetes - Hemoglobin A1c - Comprehensive metabolic panel  11. Screening for lipid disorders - Lipid panel  12. Encounter for vitamin deficiency screening - VITAMIN D 25 Hydroxy (Vit-D Deficiency, Fractures)   This visit occurred during the SARS-CoV-2 public health emergency.  Safety protocols were in place, including screening questions prior to the visit, additional usage of staff PPE, and extensive cleaning of exam room while observing appropriate contact time as indicated for disinfecting solutions.

## 2020-06-20 LAB — LIPID PANEL
Cholesterol: 161 mg/dL (ref <200)
HDL: 57 mg/dL (ref >=50)
LDL Cholesterol (Calc): 83 mg/dL (calc)
Non-HDL Cholesterol (Calc): 104 mg/dL (calc) (ref <130)
Total CHOL/HDL Ratio: 2.8 (calc) (ref <5.0)
Triglycerides: 112 mg/dL (ref <150)

## 2020-06-20 LAB — HEMOGLOBIN A1C
Hgb A1c MFr Bld: 5.7 % of total Hgb — ABNORMAL HIGH (ref <5.7)
Mean Plasma Glucose: 117 (calc)
eAG (mmol/L): 6.5 (calc)

## 2020-06-20 LAB — COMPREHENSIVE METABOLIC PANEL
AG Ratio: 1.6 (calc) (ref 1.0–2.5)
ALT: 9 U/L (ref 6–29)
AST: 15 U/L (ref 10–35)
Albumin: 4.3 g/dL (ref 3.6–5.1)
Alkaline phosphatase (APISO): 82 U/L (ref 37–153)
BUN: 14 mg/dL (ref 7–25)
CO2: 25 mmol/L (ref 20–32)
Calcium: 9.4 mg/dL (ref 8.6–10.4)
Chloride: 107 mmol/L (ref 98–110)
Creat: 0.72 mg/dL (ref 0.50–0.99)
Globulin: 2.7 g/dL (calc) (ref 1.9–3.7)
Glucose, Bld: 92 mg/dL (ref 65–99)
Potassium: 4.3 mmol/L (ref 3.5–5.3)
Sodium: 140 mmol/L (ref 135–146)
Total Bilirubin: 0.3 mg/dL (ref 0.2–1.2)
Total Protein: 7 g/dL (ref 6.1–8.1)

## 2020-06-20 LAB — VITAMIN D 25 HYDROXY (VIT D DEFICIENCY, FRACTURES): Vit D, 25-Hydroxy: 53 ng/mL (ref 30–100)

## 2020-06-22 ENCOUNTER — Encounter: Payer: Self-pay | Admitting: Family Medicine

## 2020-07-04 ENCOUNTER — Encounter: Payer: Self-pay | Admitting: Family Medicine

## 2020-07-09 ENCOUNTER — Ambulatory Visit (INDEPENDENT_AMBULATORY_CARE_PROVIDER_SITE_OTHER): Payer: Medicare Other

## 2020-07-09 VITALS — Ht 64.5 in | Wt 214.0 lb

## 2020-07-09 DIAGNOSIS — Z0000 Encounter for general adult medical examination without abnormal findings: Secondary | ICD-10-CM

## 2020-07-09 NOTE — Progress Notes (Signed)
Subjective:   Angela Scott is a 69 y.o. female who presents for Medicare Annual (Subsequent) preventive examination.  I connected with Lelar today by telephone and verified that I am speaking with the correct person using two identifiers. Location patient: home Location provider: work Persons participating in the virtual visit: patient, Engineer, civil (consulting).    I discussed the limitations, risks, security and privacy concerns of performing an evaluation and management service by telephone and the availability of in person appointments. I also discussed with the patient that there may be a patient responsible charge related to this service. The patient expressed understanding and verbally consented to this telephonic visit.    Interactive audio and video telecommunications were attempted between this provider and patient, however failed, due to patient having technical difficulties OR patient did not have access to video capability.  We continued and completed visit with audio only.  Some vital signs may be absent or patient reported.   Time Spent with patient on telephone encounter: 45 minutes   Review of Systems     Cardiac Risk Factors include: advanced age (>18men, >43 women);obesity (BMI >30kg/m2);smoking/ tobacco exposure     Objective:    Today's Vitals   07/09/20 1501  Weight: 214 lb (97.1 kg)  Height: 5' 4.5" (1.638 m)   Body mass index is 36.17 kg/m.  Advanced Directives 07/09/2020 03/06/2020 10/31/2019 09/28/2018 07/19/2017 01/01/2015 12/30/2014  Does Patient Have a Medical Advance Directive? Yes No No Yes No Yes Yes  Type of Estate agent of [place];Living will - - - - Midwife;Living will Healthcare Power of [place];Living will  Does patient want to make changes to medical advance directive? Yes (MAU/Ambulatory/Procedural Areas - Information given) - - No - Patient declined - - -  Copy of Healthcare Power of Attorney in Chart? No - copy  requested - - - - No - copy requested -  Would patient like information on creating a medical advance directive? - No - Patient declined No - Patient declined - - - -    Current Medications (verified) Outpatient Encounter Medications as of 07/09/2020  Medication Sig  . ibuprofen (ADVIL) 400 MG tablet Take 400 mg by mouth as needed.  Marland Kitchen liothyronine (CYTOMEL) 50 MCG tablet Take 50 mcg by mouth every other day.  . Multiple Vitamin (MULTIVITAMIN WITH MINERALS) TABS tablet Take 1 tablet by mouth daily.  Marland Kitchen SYNTHROID 200 MCG tablet TAKE 1 TABLET DAILY (Patient taking differently: Take 200 mcg by mouth daily before breakfast. )   No facility-administered encounter medications on file as of 07/09/2020.    Allergies (verified) Clindamycin/lincomycin, Methotrexate derivatives, Augmentin [amoxicillin-pot clavulanate], and Other   History: Past Medical History:  Diagnosis Date  . Bronchitis   . Difficult intubation    States x 2 with Lap Band 2007 at [facility] and at [facility] 06/2019; reports severe sore throat for both surgeries and coughed up blood post surgery x 1 week  . Eustachian tube dysfunction   . GERD (gastroesophageal reflux disease)   . History of Graves' disease   . Hypothyroidism   . Lupus (HCC)   . PVC's (premature ventricular contractions)    Likely outflow tract origin, LVEF normal   Past Surgical History:  Procedure Laterality Date  . KNEE SURGERY Left   . LAPAROSCOPIC GASTRIC BANDING    . LAPAROSCOPIC GASTRIC BANDING WITH HIATAL HERNIA REPAIR    . LUMBAR LAMINECTOMY    . MANDIBLE SURGERY    . MASS  EXCISION Left 01/01/2015   Procedure: EXCISION MASS LEFT 1ST WEB SPACE;  Surgeon: Cindee Salt, MD;  Location: [facility];  Service: Orthopedics;  Laterality: Left;  . SPINAL FUSION  09/2019  . TONSILLECTOMY     Family History  Problem Relation Age of Onset  . Brain cancer Father        Glioblastoma  . Arrhythmia Brother        Ablation   Social  History   Socioeconomic History  . Marital status: Single    Spouse name: Not on file  . Number of children: Not on file  . Years of education: Not on file  . Highest education level: Not on file  Occupational History  . Not on file  Tobacco Use  . Smoking status: Current Every Day Smoker    Packs/day: 0.50    Years: 48.00    Pack years: 24.00    Types: Cigarettes  . Smokeless tobacco: Never Used  Vaping Use  . Vaping Use: Never used  Substance and Sexual Activity  . Alcohol use: Yes    Comment: occ  . Drug use: No  . Sexual activity: Not on file  Other Topics Concern  . Not on file  Social History Narrative  . Not on file   Social Determinants of Health   Financial Resource Strain: Low Risk   . Difficulty of Paying Living Expenses: Not hard at all  Food Insecurity: No Food Insecurity  . Worried About Programme researcher, broadcasting/film/video in the Last Year: Never true  . Ran Out of Food in the Last Year: Never true  Transportation Needs: No Transportation Needs  . Lack of Transportation (Medical): No  . Lack of Transportation (Non-Medical): No  Physical Activity: Insufficiently Active  . Days of Exercise per Week: 2 days  . Minutes of Exercise per Session: 60 min  Stress: No Stress Concern Present  . Feeling of Stress : Not at all  Social Connections: Moderately Integrated  . Frequency of Communication with Friends and Family: More than three times a week  . Frequency of Social Gatherings with Friends and Family: Once a week  . Attends Religious Services: More than 4 times per year  . Active Member of Clubs or Organizations: Yes  . Attends Banker Meetings: 1 to 4 times per year  . Marital Status: Never married    Tobacco Counseling Ready to quit: Not Answered Counseling given: Not Answered   Clinical Intake:  Pre-visit preparation completed: Yes  Pain : No/denies pain     Nutritional Status: BMI > 30  Obese Nutritional Risks: None, Unintentional weight  gain (20 lbs since back surgery due to decreased activity) Diabetes: No  How often do you need to have someone help you when you read instructions, pamphlets, or other written materials from your doctor or pharmacy?: 1 - Never What is the last grade level you completed in school?: Dental School  Diabetic?No  Interpreter Needed?: No  Information entered by :: Thomasenia Sales LPN   Activities of Daily Living In your present state of health, do you have any difficulty performing the following activities: 07/09/2020 09/26/2019  Hearing? N -  Vision? N -  Difficulty concentrating or making decisions? N -  Walking or climbing stairs? N -  Dressing or bathing? N -  Doing errands, shopping? N N  Preparing Food and eating ? N -  Using the Toilet? N -  In the past six months, have you accidently  leaked urine? Y -  Comment with coughing or sneezing -  Do you have problems with loss of bowel control? N -  Managing your Medications? N -  Managing your Finances? N -  Housekeeping or managing your Housekeeping? N -  Some recent data might be hidden    Patient Care Team: Overton Mam, DO as PCP - General (Family Medicine) Jonelle Sidle, MD as PCP - Cardiology (Cardiology)  Indicate any recent Medical Services you may have received from other than [facility] providers in the past year (date may be approximate).     Assessment:   This is a routine wellness examination for [place].  Hearing/Vision screen  Hearing Screening   125Hz  250Hz  500Hz  1000Hz  2000Hz  3000Hz  4000Hz  6000Hz  8000Hz   Right ear:           Left ear:           Comments: No issues  Vision Screening Comments: Wears contacts Last eye exam-2020  Dietary issues and exercise activities discussed: Current Exercise Habits: Home exercise routine, Type of exercise: strength training/weights;treadmill, Time (Minutes): 60, Frequency (Times/Week): 2, Weekly Exercise (Minutes/Week): 120, Intensity: Mild, Exercise limited by:  orthopedic condition(s)  Goals    . Patient Stated     Increase activity      Depression Screen PHQ 2/9 Scores 07/09/2020 06/19/2020  PHQ - 2 Score 1 0    Fall Risk Fall Risk  07/09/2020  Falls in the past year? 1  Number falls in past yr: 1  Injury with Fall? 0  Risk for fall due to : History of fall(s)  Follow up Falls prevention discussed    Any stairs in or around the home? Yes  If so, are there any without handrails? No  Home free of loose throw rugs in walkways, pet beds, electrical cords, etc? Yes  Adequate lighting in your home to reduce risk of falls? Yes   ASSISTIVE DEVICES UTILIZED TO PREVENT FALLS:  Life alert? No  Use of a cane, walker or w/c? No  Grab bars in the bathroom? No  Shower chair or bench in shower? No  Elevated toilet seat or a handicapped toilet? No   TIMED UP AND GO:  Was the test performed? No . Phone visit   Cognitive Function:No cognitive impairment noted        Immunizations  There is no immunization history on file for this patient.  TDAP status: Due, Education has been provided regarding the importance of this vaccine. Advised may receive this vaccine at local pharmacy or Health Dept. Aware to provide a copy of the vaccination record if obtained from local pharmacy or Health Dept. Verbalized acceptance and understanding.   Flu Vaccine status: Declined, Education has been provided regarding the importance of this vaccine but patient still declined. Advised may receive this vaccine at local pharmacy or Health Dept. Aware to provide a copy of the vaccination record if obtained from local pharmacy or Health Dept. Verbalized acceptance and understanding.   Pneumococcal vaccine status: Declined,  Education has been provided regarding the importance of this vaccine but patient still declined. Advised may receive this vaccine at local pharmacy or Health Dept. Aware to provide a copy of the vaccination record if obtained from local pharmacy or  Health Dept. Verbalized acceptance and understanding.    Covid-19 vaccine status: Declined, Education has been provided regarding the importance of this vaccine but patient still declined. Advised may receive this vaccine at local pharmacy or Health Dept.or vaccine clinic. Aware  to provide a copy of the vaccination record if obtained from local pharmacy or Health Dept. Verbalized acceptance and understanding.  Qualifies for Shingles Vaccine? Yes   Zostavax completed No   Shingrix Completed?: No.    Education has been provided regarding the importance of this vaccine. Patient has been advised to call insurance company to determine out of pocket expense if they have not yet received this vaccine. Advised may also receive vaccine at local pharmacy or Health Dept. Verbalized acceptance and understanding.  Screening Tests Health Maintenance  Topic Date Due  . Hepatitis C Screening  Never done  . TETANUS/TDAP  Never done  . MAMMOGRAM  Never done  . COLONOSCOPY  Never done  . DEXA SCAN  Never done  . COVID-19 Vaccine (1) 07/25/2020 (Originally 03/23/1963)  . INFLUENZA VACCINE  12/03/2020 (Originally 04/05/2020)  . PNA vac Low Risk Adult (1 of 2 - PCV13) 07/09/2021 (Originally 03/23/2017)    Health Maintenance  Health Maintenance Due  Topic Date Due  . Hepatitis C Screening  Never done  . TETANUS/TDAP  Never done  . MAMMOGRAM  Never done  . COLONOSCOPY  Never done  . DEXA SCAN  Never done    Colorectal cancer screening: Declined  Mammogram status: Scheduled for 07/16/2020  Bone Density status: Scheduled for 10/15/2020  Lung Cancer Screening: (Low Dose CT Chest recommended if Age 53-80 years, 30 pack-year currently smoking OR have quit w/in 15years.) does qualify.   Lung Cancer Screening Referral: Declined  Additional Screening:  Hepatitis C Screening: does qualify; Discuss with PCP  Vision Screening: Recommended annual ophthalmology exams for early detection of glaucoma and other  disorders of the eye. Is the patient up to date with their annual eye exam?  Yes  Who is the provider or what is the name of the office in which the patient attends annual eye exams? Patient is in the process of finding a new eye doctor.    Dental Screening: Recommended annual dental exams for proper oral hygiene  Community Resource Referral / Chronic Care Management: CRR required this visit?  No   CCM required this visit?  No      Plan:     I have personally reviewed and noted the following in the patient's chart:   . Medical and social history . Use of alcohol, tobacco or illicit drugs  . Current medications and supplements . Functional ability and status . Nutritional status . Physical activity . Advanced directives . List of other physicians . Hospitalizations, surgeries, and ER visits in previous 12 months . Vitals . Screenings to include cognitive, depression, and falls . Referrals and appointments  In addition, I have reviewed and discussed with patient certain preventive protocols, quality metrics, and best practice recommendations. A written personalized care plan for preventive services as well as general preventive health recommendations were provided to patient.   Due to this being a telephonic visit, the after visit summary with patients personalized plan was offered to patient via mail or my-chart. Patient would like to access on my-chart.  Roanna RaiderMartha A Kendi Defalco, LPN   16/1/096011/12/2019  Nurse Health Advisor  Nurse Notes: None

## 2020-07-09 NOTE — Patient Instructions (Signed)
Angela Scott , Thank you for taking time to complete your Medicare Wellness Visit. I appreciate your ongoing commitment to your health goals. Please review the following plan we discussed and let me know if I can assist you in the future.   Screening recommendations/referrals: Colonoscopy: Declined Mammogram: Scheduled for 07/16/2020 Bone Density: Scheduled for 10/15/2020 Recommended yearly ophthalmology/optometry visit for glaucoma screening and checkup Recommended yearly dental visit for hygiene and checkup  Vaccinations: Influenza vaccine: Declined Pneumococcal vaccine: Declined Tdap vaccine: Due Shingles vaccine: Declined  Covid-19:Declined  Advanced directives: Please bring a copy for your chart  Conditions/risks identified: See problem list  Next appointment: Follow up in one year for your annual wellness visit   Preventive Care 68 Years and Older, Female Preventive care refers to lifestyle choices and visits with your health care provider that can promote health and wellness. What does preventive care include?  A yearly physical exam. This is also called an annual well check.  Dental exams once or twice a year.  Routine eye exams. Ask your health care provider how often you should have your eyes checked.  Personal lifestyle choices, including:  Daily care of your teeth and gums.  Regular physical activity.  Eating a healthy diet.  Avoiding tobacco and drug use.  Limiting alcohol use.  Practicing safe sex.  Taking low-dose aspirin every day.  Taking vitamin and mineral supplements as recommended by your health care provider. What happens during an annual well check? The services and screenings done by your health care provider during your annual well check will depend on your age, overall health, lifestyle risk factors, and family history of disease. Counseling  Your health care provider may ask you questions about your:  Alcohol use.  Tobacco use.  Drug  use.  Emotional well-being.  Home and relationship well-being.  Sexual activity.  Eating habits.  History of falls.  Memory and ability to understand (cognition).  Work and work Astronomer.  Reproductive health. Screening  You may have the following tests or measurements:  Height, weight, and BMI.  Blood pressure.  Lipid and cholesterol levels. These may be checked every 5 years, or more frequently if you are over 68 years old.  Skin check.  Lung cancer screening. You may have this screening every year starting at age 68 if you have a 30-pack-year history of smoking and currently smoke or have quit within the past 15 years.  Fecal occult blood test (FOBT) of the stool. You may have this test every year starting at age 68.  Flexible sigmoidoscopy or colonoscopy. You may have a sigmoidoscopy every 5 years or a colonoscopy every 10 years starting at age 68.  Hepatitis C blood test.  Hepatitis B blood test.  Sexually transmitted disease (STD) testing.  Diabetes screening. This is done by checking your blood sugar (glucose) after you have not eaten for a while (fasting). You may have this done every 1-3 years.  Bone density scan. This is done to screen for osteoporosis. You may have this done starting at age 68.  Mammogram. This may be done every 1-2 years. Talk to your health care provider about how often you should have regular mammograms. Talk with your health care provider about your test results, treatment options, and if necessary, the need for more tests. Vaccines  Your health care provider may recommend certain vaccines, such as:  Influenza vaccine. This is recommended every year.  Tetanus, diphtheria, and acellular pertussis (Tdap, Td) vaccine. You may need a Td booster  every 10 years.  Zoster vaccine. You may need this after age 71.  Pneumococcal 13-valent conjugate (PCV13) vaccine. One dose is recommended after age 68.  Pneumococcal polysaccharide  (PPSV23) vaccine. One dose is recommended after age 68. Talk to your health care provider about which screenings and vaccines you need and how often you need them. This information is not intended to replace advice given to you by your health care provider. Make sure you discuss any questions you have with your health care provider. Document Released: 09/18/2015 Document Revised: 05/11/2016 Document Reviewed: 06/23/2015 Elsevier Interactive Patient Education  2017 [employer] Prevention in the Home Falls can cause injuries. They can happen to people of all ages. There are many things you can do to make your home safe and to help prevent falls. What can I do on the outside of my home?  Regularly fix the edges of walkways and driveways and fix any cracks.  Remove anything that might make you trip as you walk through a door, such as a raised step or threshold.  Trim any bushes or trees on the path to your home.  Use bright outdoor lighting.  Clear any walking paths of anything that might make someone trip, such as rocks or tools.  Regularly check to see if handrails are loose or broken. Make sure that both sides of any steps have handrails.  Any raised decks and porches should have guardrails on the edges.  Have any leaves, snow, or ice cleared regularly.  Use sand or salt on walking paths during winter.  Clean up any spills in your garage right away. This includes oil or grease spills. What can I do in the bathroom?  Use night lights.  Install grab bars by the toilet and in the tub and shower. Do not use towel bars as grab bars.  Use non-skid mats or decals in the tub or shower.  If you need to sit down in the shower, use a plastic, non-slip stool.  Keep the floor dry. Clean up any water that spills on the floor as soon as it happens.  Remove soap buildup in the tub or shower regularly.  Attach bath mats securely with double-sided non-slip rug tape.  Do not have  throw rugs and other things on the floor that can make you trip. What can I do in the bedroom?  Use night lights.  Make sure that you have a light by your bed that is easy to reach.  Do not use any sheets or blankets that are too big for your bed. They should not hang down onto the floor.  Have a firm chair that has side arms. You can use this for support while you get dressed.  Do not have throw rugs and other things on the floor that can make you trip. What can I do in the kitchen?  Clean up any spills right away.  Avoid walking on wet floors.  Keep items that you use a lot in easy-to-reach places.  If you need to reach something above you, use a strong step stool that has a grab bar.  Keep electrical cords out of the way.  Do not use floor polish or wax that makes floors slippery. If you must use wax, use non-skid floor wax.  Do not have throw rugs and other things on the floor that can make you trip. What can I do with my stairs?  Do not leave any items on the stairs.  Make  sure that there are handrails on both sides of the stairs and use them. Fix handrails that are broken or loose. Make sure that handrails are as long as the stairways.  Check any carpeting to make sure that it is firmly attached to the stairs. Fix any carpet that is loose or worn.  Avoid having throw rugs at the top or bottom of the stairs. If you do have throw rugs, attach them to the floor with carpet tape.  Make sure that you have a light switch at the top of the stairs and the bottom of the stairs. If you do not have them, ask someone to add them for you. What else can I do to help prevent falls?  Wear shoes that:  Do not have high heels.  Have rubber bottoms.  Are comfortable and fit you well.  Are closed at the toe. Do not wear sandals.  If you use a stepladder:  Make sure that it is fully opened. Do not climb a closed stepladder.  Make sure that both sides of the stepladder are  locked into place.  Ask someone to hold it for you, if possible.  Clearly mark and make sure that you can see:  Any grab bars or handrails.  First and last steps.  Where the edge of each step is.  Use tools that help you move around (mobility aids) if they are needed. These include:  Canes.  Walkers.  Scooters.  Crutches.  Turn on the lights when you go into a dark area. Replace any light bulbs as soon as they burn out.  Set up your furniture so you have a clear path. Avoid moving your furniture around.  If any of your floors are uneven, fix them.  If there are any pets around you, be aware of where they are.  Review your medicines with your doctor. Some medicines can make you feel dizzy. This can increase your chance of falling. Ask your doctor what other things that you can do to help prevent falls. This information is not intended to replace advice given to you by your health care provider. Make sure you discuss any questions you have with your health care provider. Document Released: 06/18/2009 Document Revised: 01/28/2016 Document Reviewed: 09/26/2014 Elsevier Interactive Patient Education  2017 [employer].

## 2020-07-16 ENCOUNTER — Other Ambulatory Visit: Payer: Self-pay

## 2020-07-16 ENCOUNTER — Ambulatory Visit
Admission: RE | Admit: 2020-07-16 | Discharge: 2020-07-16 | Disposition: A | Discharge status: Home / Self Care | Payer: Medicare Other | Source: Ambulatory Visit | Attending: Family Medicine | Admitting: Family Medicine

## 2020-07-16 DIAGNOSIS — Z1231 Encounter for screening mammogram for malignant neoplasm of breast: Secondary | ICD-10-CM

## 2020-07-17 ENCOUNTER — Encounter (INDEPENDENT_AMBULATORY_CARE_PROVIDER_SITE_OTHER): Payer: Self-pay

## 2020-07-22 ENCOUNTER — Other Ambulatory Visit: Payer: Self-pay | Admitting: Family Medicine

## 2020-07-22 DIAGNOSIS — R928 Other abnormal and inconclusive findings on diagnostic imaging of breast: Secondary | ICD-10-CM

## 2020-08-05 ENCOUNTER — Ambulatory Visit: Payer: Medicare Other

## 2020-08-05 ENCOUNTER — Other Ambulatory Visit: Payer: Self-pay | Admitting: Family Medicine

## 2020-08-05 ENCOUNTER — Ambulatory Visit
Admission: RE | Admit: 2020-08-05 | Discharge: 2020-08-05 | Disposition: A | Discharge status: Home / Self Care | Payer: Medicare Other | Source: Ambulatory Visit | Attending: Family Medicine | Admitting: Family Medicine

## 2020-08-05 ENCOUNTER — Other Ambulatory Visit: Payer: Self-pay

## 2020-08-05 DIAGNOSIS — R921 Mammographic calcification found on diagnostic imaging of breast: Secondary | ICD-10-CM

## 2020-08-05 DIAGNOSIS — R928 Other abnormal and inconclusive findings on diagnostic imaging of breast: Secondary | ICD-10-CM

## 2020-08-17 ENCOUNTER — Ambulatory Visit
Admission: RE | Admit: 2020-08-17 | Discharge: 2020-08-17 | Disposition: A | Discharge status: Home / Self Care | Payer: Medicare Other | Source: Ambulatory Visit | Attending: Family Medicine | Admitting: Family Medicine

## 2020-08-17 ENCOUNTER — Other Ambulatory Visit: Payer: Self-pay | Admitting: Family Medicine

## 2020-08-17 ENCOUNTER — Other Ambulatory Visit: Payer: Self-pay

## 2020-08-17 DIAGNOSIS — R921 Mammographic calcification found on diagnostic imaging of breast: Secondary | ICD-10-CM

## 2020-08-18 ENCOUNTER — Other Ambulatory Visit: Payer: Self-pay | Admitting: Family Medicine

## 2020-08-18 DIAGNOSIS — R928 Other abnormal and inconclusive findings on diagnostic imaging of breast: Secondary | ICD-10-CM

## 2020-08-26 ENCOUNTER — Other Ambulatory Visit: Payer: Self-pay

## 2020-08-26 ENCOUNTER — Ambulatory Visit
Admission: RE | Admit: 2020-08-26 | Discharge: 2020-08-26 | Disposition: A | Discharge status: Home / Self Care | Payer: Medicare Other | Source: Ambulatory Visit | Attending: Family Medicine | Admitting: Family Medicine

## 2020-08-26 DIAGNOSIS — R928 Other abnormal and inconclusive findings on diagnostic imaging of breast: Secondary | ICD-10-CM

## 2020-09-03 ENCOUNTER — Encounter: Payer: Self-pay | Admitting: Family

## 2020-09-03 ENCOUNTER — Telehealth (INDEPENDENT_AMBULATORY_CARE_PROVIDER_SITE_OTHER): Payer: Medicare Other | Admitting: Family

## 2020-09-03 VITALS — HR 79 | Temp 97.6°F | Ht 64.0 in | Wt 219.0 lb

## 2020-09-03 DIAGNOSIS — J069 Acute upper respiratory infection, unspecified: Secondary | ICD-10-CM | POA: Diagnosis not present

## 2020-09-03 DIAGNOSIS — Z20822 Contact with and (suspected) exposure to covid-19: Secondary | ICD-10-CM | POA: Diagnosis not present

## 2020-09-03 NOTE — Progress Notes (Signed)
   Virtual Visit via Video   I connected with patient on 09/03/20 at  8:40 AM EST by a video enabled telemedicine application and verified that I am speaking with the correct person using two identifiers.  Location patient: Home Location provider: [facility], Office Persons participating in the virtual visit: Patient, Provider, CMA   I discussed the limitations of evaluation and management by telemedicine and the availability of in person appointments. The patient expressed understanding and agreed to proceed.  Subjective:   HPI:   Patient is in today with c/o sore throat, congestion, fatigue x 2 days. She is unvaccinated and reports being exposed to COVID-19. She has dimetapp that she can take if she needs it. Has not taken anything currently.   ROS:   See pertinent positives and negatives per HPI.  Patient Active Problem List   Diagnosis Date Noted  . Spondylolisthesis of lumbar region 09/26/2019  . 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 Hypothyroidism 02/05/2010  . GERD 02/05/2010  . FURUNCLE 02/05/2010  . SYSTEMIC LUPUS ERYTHEMATOSUS 02/05/2010    Social History   Tobacco Use  . Smoking status: Current Every Day Smoker    Packs/day: 0.50    Years: 48.00    Pack years: 24.00    Types: Cigarettes  . Smokeless tobacco: Never Used  Substance Use Topics  . Alcohol use: Yes    Comment: occ    Current Outpatient Medications:  .  ibuprofen (ADVIL) 400 MG tablet, Take 400 mg by mouth as needed., Disp: , Rfl:  .  liothyronine (CYTOMEL) 50 MCG tablet, Take 50 mcg by mouth every other day., Disp: , Rfl:  .  Multiple Vitamin (MULTIVITAMIN WITH MINERALS) TABS tablet, Take 1 tablet by mouth daily., Disp: , Rfl:  .  SYNTHROID 200 MCG tablet, TAKE 1 TABLET DAILY (Patient taking differently: Take 200 mcg by mouth daily before breakfast.), Disp: 30 each, Rfl: 12  Allergies  Allergen Reactions  . Clindamycin/Lincomycin     C-Dif toxicity  . Methotrexate Derivatives  Anaphylaxis    Fever,lymphadnopathy  . Augmentin [Amoxicillin-Pot Clavulanate]     N&V  . Other     absorbable sutures; Reports they do not absorb and create an abscess    Objective:   Pulse 79   Temp 97.6 F (36.4 C) (Temporal)   Ht 5\' 4"  (1.626 m)   Wt 219 lb (99.3 kg)   LMP  (LMP Unknown)   SpO2 99%   BMI 37.59 kg/m   Patient is well-developed, well-nourished in no acute distress.  Resting comfortably at home.  Head is normocephalic, atraumatic.  No labored breathing.  Speech is clear and coherent with logical content.  Patient is alert and oriented at baseline.    Assessment and Plan:    [name] was seen today for sore throat.  Diagnoses and all orders for this visit:  Suspected COVID-19 virus infection  Viral upper respiratory illness  Advised COVID-19 testing through [facility] website or [facility] website.  Is not interested in the rapid antigen test   [facility], FNP 09/03/2020

## 2020-10-04 ENCOUNTER — Encounter: Payer: Self-pay | Admitting: Family Medicine

## 2020-10-05 NOTE — Telephone Encounter (Signed)
Please see message and advise.  Thank you. ° °

## 2020-10-14 ENCOUNTER — Other Ambulatory Visit: Payer: Self-pay | Admitting: General Surgery

## 2020-10-14 DIAGNOSIS — N6092 Unspecified benign mammary dysplasia of left breast: Secondary | ICD-10-CM

## 2020-10-15 ENCOUNTER — Telehealth: Payer: Self-pay | Admitting: Cardiology

## 2020-10-15 ENCOUNTER — Other Ambulatory Visit: Payer: Self-pay

## 2020-10-15 ENCOUNTER — Ambulatory Visit
Admission: RE | Admit: 2020-10-15 | Discharge: 2020-10-15 | Disposition: A | Discharge status: Home / Self Care | Payer: Medicare Other | Source: Ambulatory Visit | Attending: Family Medicine | Admitting: Family Medicine

## 2020-10-15 DIAGNOSIS — Z13820 Encounter for screening for osteoporosis: Secondary | ICD-10-CM

## 2020-10-15 NOTE — Telephone Encounter (Signed)
   [facility] Pre-operative Risk Assessment    HEARTCARE STAFF: - Please ensure there is not already an duplicate clearance open for this procedure. - Under Visit Info/Reason for Call, type in Other and utilize the format Clearance MM/DD/YY or Clearance TBD. Do not use dashes or single digits. - If request is for dental extraction, please clarify the # of teeth to be extracted.  Request for surgical clearance:  1. What type of surgery is being performed? Left Breast Seed Guided Excisional Biopsy  2. When is this surgery scheduled? TBD  3. What type of clearance is required (medical clearance vs. Pharmacy clearance to hold med vs. Both)? Medical Clearance  4. Are there any medications that need to be held prior to surgery and how long?   5. Practice name and name of physician performing surgery? [facility], [place], Dr. Rolm Bookbinder  6. What is the office phone number? [phone]   7.   What is the office fax number?  [phone]  8.   Anesthesia type (None, local, MAC, general) ? General   Nelda Marseille 10/15/2020, 2:43 PM  _________________________________________________________________   (provider comments below)

## 2020-10-15 NOTE — Telephone Encounter (Signed)
° °  Primary Cardiologist: Nona Dell, MD  Chart reviewed as part of pre-operative protocol coverage. Patient was contacted 10/15/2020 in reference to pre-operative risk assessment for pending surgery as outlined below.  Angela Scott was last seen on 01/24/20 by Dr. Diona Browner and history included PVCs and thyroid disorder with prior normal LV function and minimal dilatation of the ascending aorta.  Anticipate clearing if doing well but called patient to assess and got VM. LMTCB.  Laurann Montana, PA-C 10/15/2020, 3:11 PM

## 2020-10-21 ENCOUNTER — Encounter: Payer: Self-pay | Admitting: Family Medicine

## 2020-10-22 ENCOUNTER — Ambulatory Visit (INDEPENDENT_AMBULATORY_CARE_PROVIDER_SITE_OTHER): Payer: Medicare Other | Admitting: Cardiology

## 2020-10-22 ENCOUNTER — Other Ambulatory Visit: Payer: Self-pay

## 2020-10-22 ENCOUNTER — Encounter: Payer: Self-pay | Admitting: Cardiology

## 2020-10-22 VITALS — BP 139/78 | HR 73 | Ht 65.0 in | Wt 219.0 lb

## 2020-10-22 DIAGNOSIS — Z01810 Encounter for preprocedural cardiovascular examination: Secondary | ICD-10-CM

## 2020-10-22 DIAGNOSIS — I493 Ventricular premature depolarization: Secondary | ICD-10-CM | POA: Diagnosis not present

## 2020-10-22 NOTE — Progress Notes (Signed)
Cardiology Office Note  Date: 10/22/2020   ID: Angela, Scott 05-16-1951, MRN [id number]  PCP:  Overton Mam, DO  Cardiologist:  Nona Dell, MD Electrophysiologist:  None   Chief Complaint  Patient presents with  . Cardiac follow-up    History of Present Illness: Angela Scott is a 70 y.o. female last seen in May 2021.  She presents for a follow-up visit, also for preoperative cardiac assessment. She is being considered for a left breast seed guided excisional biopsy under general anesthesia by Dr. Dwain Sarna.  She had a recent abnormal mammogram.  Also tells me that her sister just passed away in October 08, 2022, undergoing active treatment for breast cancer with chemotherapy and radiation.  From a cardiac perspective, she states that she has not been bothered by palpitations, feels like PVCs have been much less.  She has finally gotten her stamina back after back surgery last year.  She does not report any exertional chest pain, no syncope.  I reviewed her medications which are outlined below.  Past Medical History:  Diagnosis Date  . Bronchitis   . Difficult intubation    States x 2 with Lap Band 2007 at [facility] and at [facility] 06/2019; reports severe sore throat for both surgeries and coughed up blood post surgery x 1 week  . Eustachian tube dysfunction   . GERD (gastroesophageal reflux disease)   . History of Graves' disease   . Hypothyroidism   . Lupus (HCC)   . PVC's (premature ventricular contractions)    Likely outflow tract origin, LVEF normal    Past Surgical History:  Procedure Laterality Date  . KNEE SURGERY Left   . LAPAROSCOPIC GASTRIC BANDING    . LAPAROSCOPIC GASTRIC BANDING WITH HIATAL HERNIA REPAIR    . LUMBAR LAMINECTOMY    . MANDIBLE SURGERY    . MASS EXCISION Left 01/01/2015   Procedure: EXCISION MASS LEFT 1ST WEB SPACE;  Surgeon: Cindee Salt, MD;  Location: [facility];  Service: Orthopedics;  Laterality: Left;   . SPINAL FUSION  2019-10-09  . TONSILLECTOMY      Current Outpatient Medications  Medication Sig Dispense Refill  . ibuprofen (ADVIL) 400 MG tablet Take 400 mg by mouth as needed.    Marland Kitchen liothyronine (CYTOMEL) 50 MCG tablet Take 50 mcg by mouth every other day.    . Multiple Vitamin (MULTIVITAMIN WITH MINERALS) TABS tablet Take 1 tablet by mouth daily.    Marland Kitchen SYNTHROID 200 MCG tablet TAKE 1 TABLET DAILY (Patient taking differently: Take 200 mcg by mouth daily before breakfast.) 30 each 12  . SYNTHROID 25 MCG tablet Take 25 mcg by mouth daily.     No current facility-administered medications for this visit.   Allergies:  Clindamycin/lincomycin, Methotrexate derivatives, Augmentin [amoxicillin-pot clavulanate], and Other   ROS: No syncope.  Physical Exam: VS:  BP 139/78   Pulse 73   Ht 5\' 5"  (1.651 m)   Wt 219 lb (99.3 kg) Comment: per pt  LMP  (LMP Unknown)   SpO2 98%   BMI 36.44 kg/m , BMI Body mass index is 36.44 kg/m.  Wt Readings from Last 3 Encounters:  10/22/20 219 lb (99.3 kg)  09/03/20 219 lb (99.3 kg)  07/09/20 214 lb (97.1 kg)    General: Patient appears comfortable at rest. HEENT: Conjunctiva and lids normal, wearing a mask. Neck: Supple, no elevated JVP or carotid bruits, no thyromegaly. Lungs: Clear to auscultation, nonlabored breathing at rest. Cardiac: Regular  rate and rhythm, no S3 or significant systolic murmur, no pericardial rub. Extremities: No pitting edema.  ECG:  An ECG dated 01/24/2020 was personally reviewed today and demonstrated:  Sinus rhythm with small R' in lead V1 and V2, nonspecific T wave changes, single PVC likely from the outflow tract.  Recent Labwork: 06/19/2020: ALT 9; AST 15; BUN 14; Creat 0.72; Potassium 4.3; Sodium 140     Component Value Date/Time   CHOL 161 06/19/2020 1407   TRIG 112 06/19/2020 1407   HDL 57 06/19/2020 1407   CHOLHDL 2.8 06/19/2020 1407   LDLCALC 83 06/19/2020 1407    Other Studies Reviewed  Today:  Echocardiogram 04/26/2019: 1. The left ventricle has normal systolic function with an ejection  fraction of 60-65%. The cavity size was normal. Left ventricular diastolic  parameters were normal.  2. The right ventricle has normal systolic function. The cavity was  normal. There is no increase in right ventricular wall thickness.  3. The aorta is abnormal unless otherwise noted.  4. There is mild dilatation of the ascending aorta measuring 38 mm.   Assessment and Plan:  History of frequent PVCs, morphology consistent with outflow tract origin.  She reports fewer palpitations and feels like PVC burden has been much less recently.  No chest pain or syncope.  Heart rate is regular on examination today.  LVEF normal by assessment in 2020.  At this point no further cardiac testing is planned, would continue with observation.  She should be able to proceed with planned breast surgery under general anesthesia at an acceptable perioperative cardiac risk.  Medication Adjustments/Labs and Tests Ordered: Current medicines are reviewed at length with the patient today.  Concerns regarding medicines are outlined above.   Tests Ordered: No orders of the defined types were placed in this encounter.   Medication Changes: No orders of the defined types were placed in this encounter.   Disposition:  Follow up 1 year in the [place] office.  Signed, Jonelle Sidle, MD, [facility] 10/22/2020 9:08 AM    [facility] at [facility] [address] [place], [place] [address] Phone: [phone]; Fax: [phone]

## 2020-10-22 NOTE — Patient Instructions (Signed)
Medication Instructions:  Your physician recommends that you continue on your current medications as directed. Please refer to the Current Medication list given to you today.  *If you need a refill on your cardiac medications before your next appointment, please call your pharmacy*   Lab Work: None today If you have labs (blood work) drawn today and your tests are completely normal, you will receive your results only by: . MyChart Message (if you have MyChart) OR . A paper copy in the mail If you have any lab test that is abnormal or we need to change your treatment, we will call you to review the results.   Testing/Procedures: None today   Follow-Up: At [facility], you and your health needs are our priority.  As part of our continuing mission to provide you with exceptional heart care, we have created designated Provider Care Teams.  These Care Teams include your primary Cardiologist (physician) and Advanced Practice Providers (APPs -  Physician Assistants and Nurse Practitioners) who all work together to provide you with the care you need, when you need it.  We recommend signing up for the patient portal called "MyChart".  Sign up information is provided on this After Visit Summary.  MyChart is used to connect with patients for Virtual Visits (Telemedicine).  Patients are able to view lab/test results, encounter notes, upcoming appointments, etc.  Non-urgent messages can be sent to your provider as well.   To learn more about what you can do with MyChart, go to [email or web address].    Your next appointment:   12 month(s)  The format for your next appointment:   In Person  Provider:   Samuel McDowell, MD   Other Instructions None       Thank you for choosing [facility] !         

## 2020-10-23 NOTE — Telephone Encounter (Signed)
   Primary Cardiologist: Nona Dell, MD  Chart reviewed as part of pre-operative protocol coverage. Given past medical history and time since last visit, based on ACC/AHA guidelines, Angela Scott would be at acceptable risk for the planned procedure without further cardiovascular testing. Clearance was provided 10/22/20 during office visit. Will route this encounter as well as office note to requesting party via Epic fax function.   The patient was advised that if she develops new symptoms prior to surgery to contact our office to arrange for a follow-up visit, and she verbalized understanding.  I will route this recommendation to the requesting party via Epic fax function and remove from pre-op pool.  Please call with questions.  Alver Sorrow, NP 10/23/2020, 10:23 AM

## 2020-10-26 ENCOUNTER — Other Ambulatory Visit: Payer: Self-pay | Admitting: General Surgery

## 2020-10-26 DIAGNOSIS — N6092 Unspecified benign mammary dysplasia of left breast: Secondary | ICD-10-CM

## 2020-11-02 IMAGING — RF DG LUMBAR SPINE 2-3V
1 series · 9 of 9 positions shown · non-contrast
Comparison: 09/21/2019

CLINICAL DATA: L4-5 fusion

EXAM:
LUMBAR SPINE - 2-3 VIEW; DG C-ARM 1-60 MIN

[Series 1: run · 9 of 9 slices shown]
[im 1/9]
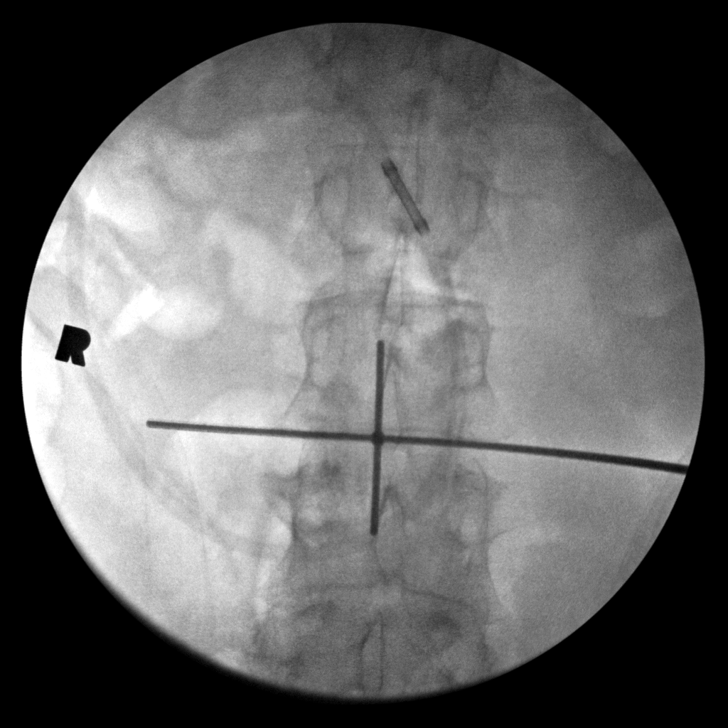
[im 2/9]
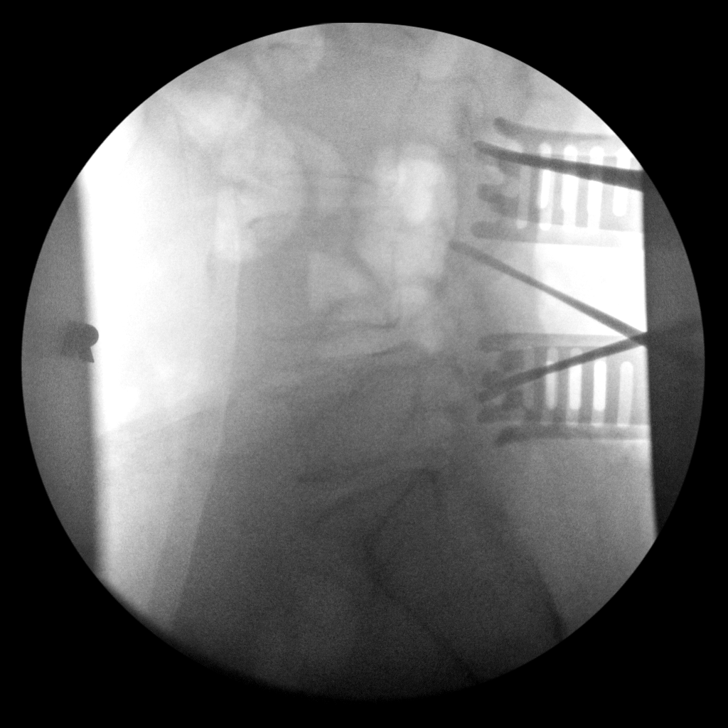
[im 3/9]
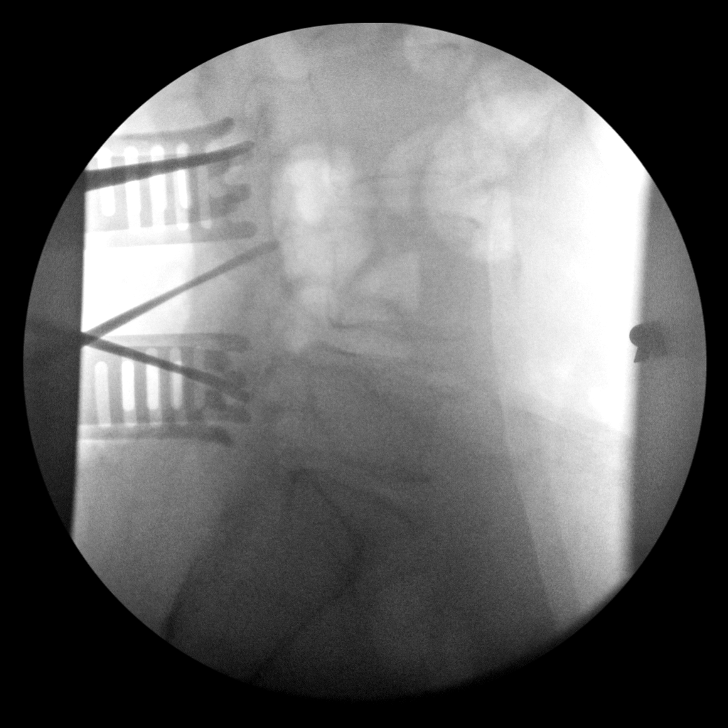
[im 4/9]
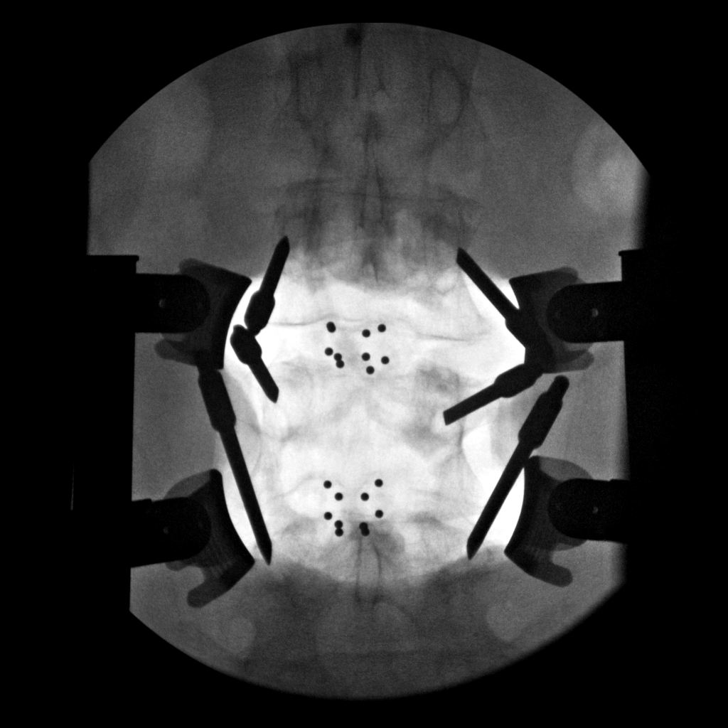
[im 5/9]
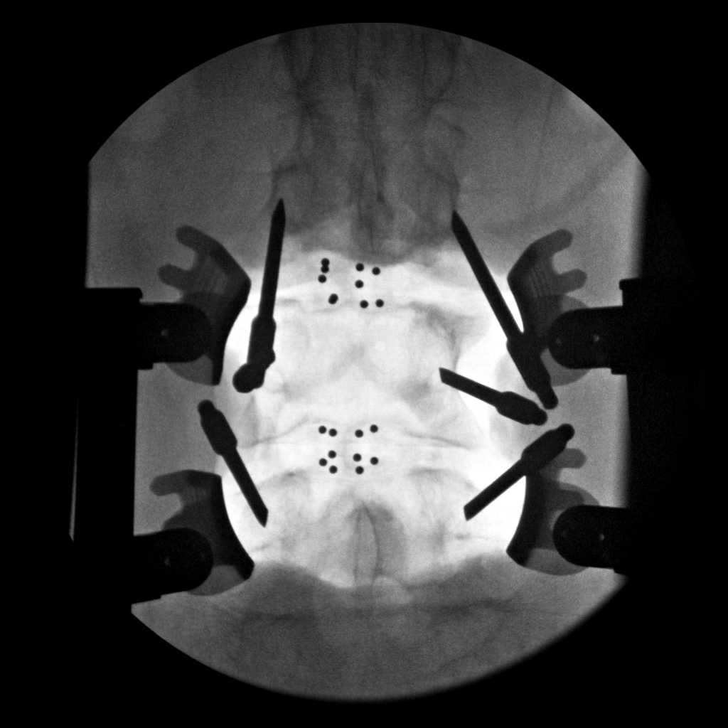
[im 6/9]
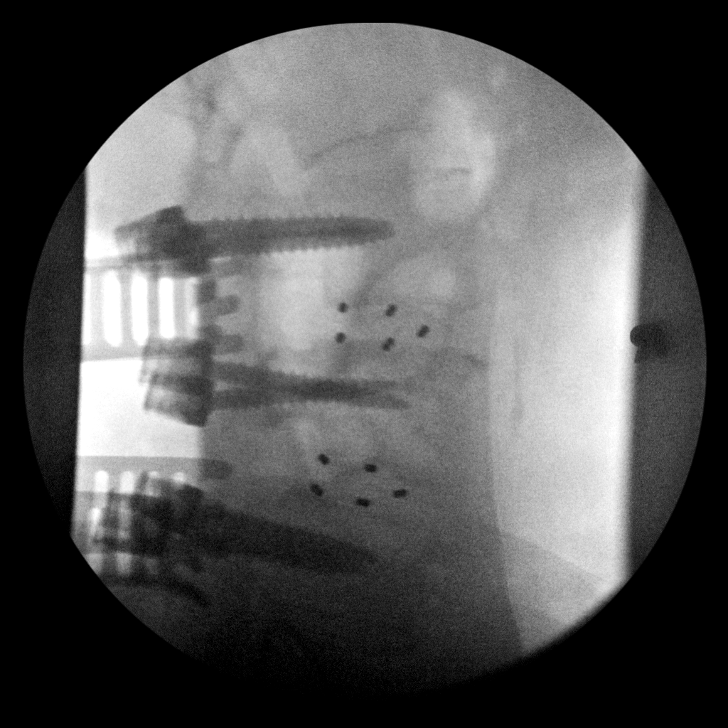
[im 7/9]
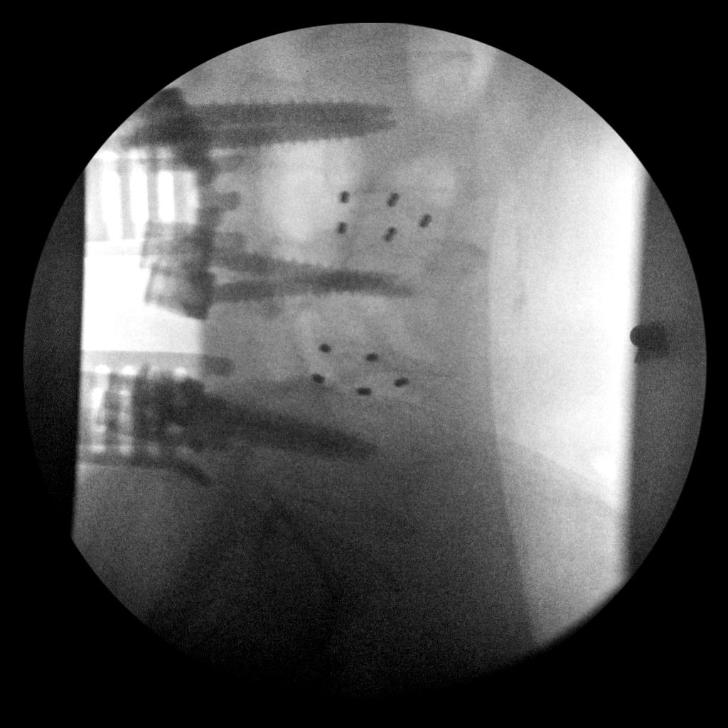
[im 8/9]
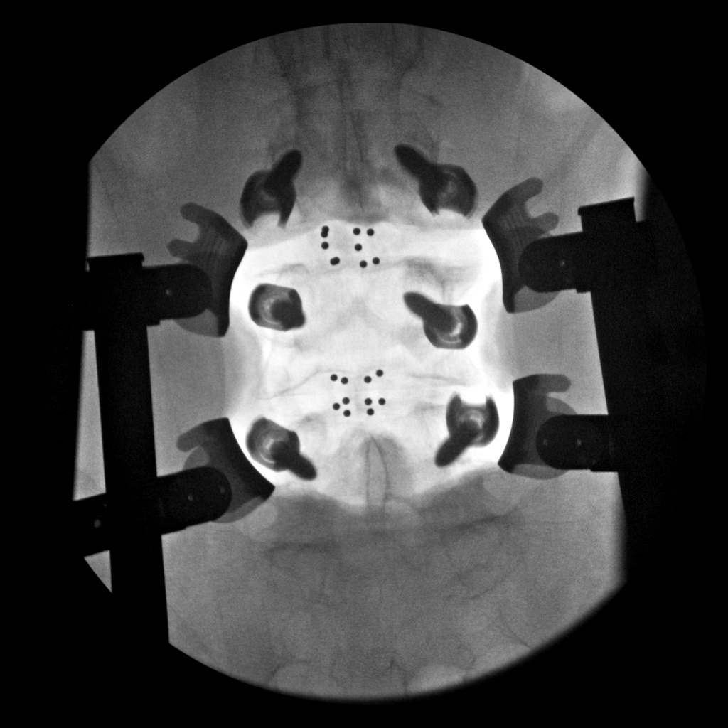
[im 9/9]
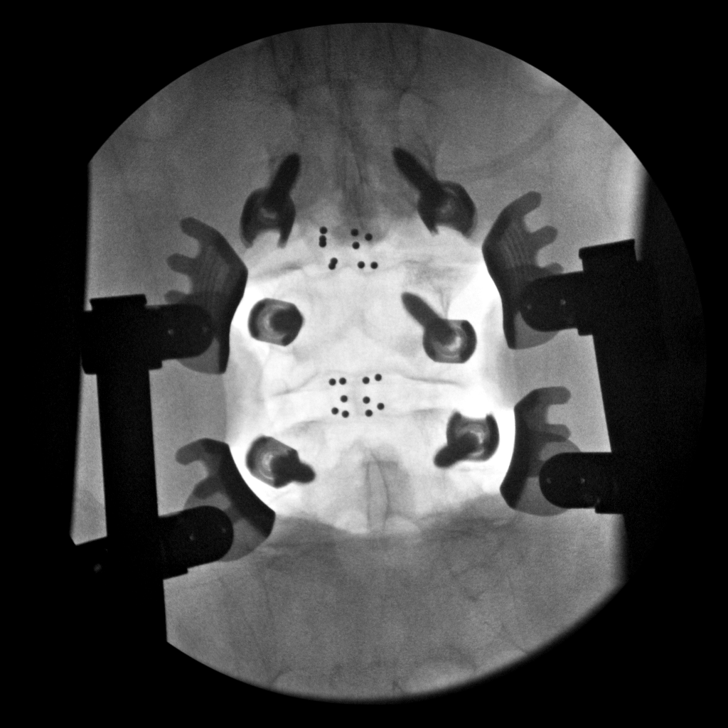

[9 of 9 positions shown; findings below may reference images not displayed]

FLUOROSCOPY TIME:  Fluoroscopy Time:  56 seconds

Radiation Exposure Index (if provided by the fluoroscopic device):
Not available

Number of Acquired Spot Images: 9
FINDINGS: Initial spot films demonstrate localization in the lower lumbar
spine. Subsequent retractors and instruments are noted at the L3-4
and L4-5 interspace. Subsequent fusion at L3-4 and L4-5 is noted
with pedicle screw placement at all 3 levels.
IMPRESSION: Lumbar fusion from L3-L5.

## 2020-11-10 ENCOUNTER — Other Ambulatory Visit (HOSPITAL_COMMUNITY): Payer: Medicare Other

## 2020-11-10 ENCOUNTER — Other Ambulatory Visit: Payer: Self-pay

## 2020-11-10 ENCOUNTER — Encounter (HOSPITAL_BASED_OUTPATIENT_CLINIC_OR_DEPARTMENT_OTHER): Payer: Self-pay | Admitting: General Surgery

## 2020-11-10 ENCOUNTER — Other Ambulatory Visit (HOSPITAL_COMMUNITY)
Admission: RE | Admit: 2020-11-10 | Discharge: 2020-11-10 | Disposition: A | Discharge status: Home / Self Care | Payer: Medicare Other | Source: Ambulatory Visit | Attending: General Surgery | Admitting: General Surgery

## 2020-11-10 DIAGNOSIS — Z20822 Contact with and (suspected) exposure to covid-19: Secondary | ICD-10-CM | POA: Diagnosis not present

## 2020-11-10 DIAGNOSIS — Z01812 Encounter for preprocedural laboratory examination: Secondary | ICD-10-CM | POA: Insufficient documentation

## 2020-11-10 LAB — SARS CORONAVIRUS 2 (TAT 6-24 HRS): SARS Coronavirus 2: NEGATIVE

## 2020-11-13 ENCOUNTER — Other Ambulatory Visit (HOSPITAL_COMMUNITY)
Admission: RE | Admit: 2020-11-13 | Discharge: 2020-11-13 | Disposition: A | Discharge status: Home / Self Care | Payer: Medicare Other | Source: Ambulatory Visit | Attending: General Surgery | Admitting: General Surgery

## 2020-11-13 DIAGNOSIS — Z01812 Encounter for preprocedural laboratory examination: Secondary | ICD-10-CM | POA: Diagnosis present

## 2020-11-13 DIAGNOSIS — Z20822 Contact with and (suspected) exposure to covid-19: Secondary | ICD-10-CM | POA: Insufficient documentation

## 2020-11-13 LAB — SARS CORONAVIRUS 2 (TAT 6-24 HRS): SARS Coronavirus 2: NEGATIVE

## 2020-11-16 ENCOUNTER — Ambulatory Visit
Admission: RE | Admit: 2020-11-16 | Discharge: 2020-11-16 | Disposition: A | Discharge status: Home / Self Care | Payer: Medicare Other | Source: Ambulatory Visit | Attending: General Surgery | Admitting: General Surgery

## 2020-11-16 ENCOUNTER — Other Ambulatory Visit: Payer: Self-pay

## 2020-11-16 DIAGNOSIS — N6092 Unspecified benign mammary dysplasia of left breast: Secondary | ICD-10-CM

## 2020-11-16 NOTE — Progress Notes (Signed)

## 2020-11-17 ENCOUNTER — Ambulatory Visit (HOSPITAL_BASED_OUTPATIENT_CLINIC_OR_DEPARTMENT_OTHER): Payer: Medicare Other | Admitting: Anesthesiology

## 2020-11-17 ENCOUNTER — Other Ambulatory Visit: Payer: Self-pay

## 2020-11-17 ENCOUNTER — Encounter (HOSPITAL_BASED_OUTPATIENT_CLINIC_OR_DEPARTMENT_OTHER): Payer: Self-pay | Admitting: General Surgery

## 2020-11-17 ENCOUNTER — Ambulatory Visit
Admission: RE | Admit: 2020-11-17 | Discharge: 2020-11-17 | Disposition: A | Discharge status: Home / Self Care | Payer: Medicare Other | Source: Ambulatory Visit | Attending: General Surgery | Admitting: General Surgery

## 2020-11-17 ENCOUNTER — Encounter (HOSPITAL_BASED_OUTPATIENT_CLINIC_OR_DEPARTMENT_OTHER)
Admission: RE | Disposition: A | Discharge status: Home / Self Care | Payer: Self-pay | Source: Home / Self Care | Attending: General Surgery

## 2020-11-17 ENCOUNTER — Ambulatory Visit (HOSPITAL_BASED_OUTPATIENT_CLINIC_OR_DEPARTMENT_OTHER)
Admission: RE | Admit: 2020-11-17 | Discharge: 2020-11-17 | Disposition: A | Discharge status: Home / Self Care | Payer: Medicare Other | Attending: General Surgery | Admitting: General Surgery

## 2020-11-17 DIAGNOSIS — F17200 Nicotine dependence, unspecified, uncomplicated: Secondary | ICD-10-CM | POA: Insufficient documentation

## 2020-11-17 DIAGNOSIS — N6092 Unspecified benign mammary dysplasia of left breast: Secondary | ICD-10-CM

## 2020-11-17 DIAGNOSIS — Z803 Family history of malignant neoplasm of breast: Secondary | ICD-10-CM | POA: Insufficient documentation

## 2020-11-17 DIAGNOSIS — N6489 Other specified disorders of breast: Secondary | ICD-10-CM | POA: Insufficient documentation

## 2020-11-17 HISTORY — PX: RADIOACTIVE SEED GUIDED EXCISIONAL BREAST BIOPSY: SHX6490

## 2020-11-17 SURGERY — RADIOACTIVE SEED GUIDED BREAST BIOPSY
Anesthesia: General | Site: Breast | Laterality: Left

## 2020-11-17 MED ORDER — PROPOFOL 500 MG/50ML IV EMUL
INTRAVENOUS | Status: DC | PRN
  Administered 2020-11-17: 50 ug/kg/min via INTRAVENOUS

## 2020-11-17 MED ORDER — FENTANYL CITRATE (PF) 100 MCG/2ML IJ SOLN
INTRAMUSCULAR | Status: AC
  Filled 2020-11-17: qty 2

## 2020-11-17 MED ORDER — FENTANYL CITRATE (PF) 100 MCG/2ML IJ SOLN
25.0000 ug | INTRAMUSCULAR | Status: DC | PRN
Start: 2020-11-17 — End: 2020-11-17

## 2020-11-17 MED ORDER — BACITRACIN ZINC 500 UNIT/GM EX OINT
TOPICAL_OINTMENT | CUTANEOUS | Status: AC
  Filled 2020-11-17: qty 28.35

## 2020-11-17 MED ORDER — MIDAZOLAM HCL 5 MG/5ML IJ SOLN
INTRAMUSCULAR | Status: DC | PRN
  Administered 2020-11-17: 2 mg via INTRAVENOUS

## 2020-11-17 MED ORDER — PROPOFOL 10 MG/ML IV BOLUS
INTRAVENOUS | Status: DC | PRN
  Administered 2020-11-17: 50 mg via INTRAVENOUS
  Administered 2020-11-17: 150 mg via INTRAVENOUS

## 2020-11-17 MED ORDER — CIPROFLOXACIN IN D5W 400 MG/200ML IV SOLN
INTRAVENOUS | Status: AC
  Filled 2020-11-17: qty 200

## 2020-11-17 MED ORDER — LACTATED RINGERS IV SOLN: INTRAVENOUS | Status: DC

## 2020-11-17 MED ORDER — LIDOCAINE 2% (20 MG/ML) 5 ML SYRINGE
INTRAMUSCULAR | Status: AC
  Filled 2020-11-17: qty 5

## 2020-11-17 MED ORDER — ONDANSETRON HCL 4 MG/2ML IJ SOLN: 4.0000 mg | Freq: Once | INTRAMUSCULAR | Status: DC | PRN

## 2020-11-17 MED ORDER — BUPIVACAINE HCL (PF) 0.25 % IJ SOLN
INTRAMUSCULAR | Status: DC | PRN
  Administered 2020-11-17: 10 mL

## 2020-11-17 MED ORDER — ONDANSETRON HCL 4 MG/2ML IJ SOLN
INTRAMUSCULAR | Status: AC
  Filled 2020-11-17: qty 2

## 2020-11-17 MED ORDER — ONDANSETRON HCL 4 MG/2ML IJ SOLN
INTRAMUSCULAR | Status: DC | PRN
  Administered 2020-11-17: 4 mg via INTRAVENOUS

## 2020-11-17 MED ORDER — ACETAMINOPHEN 500 MG PO TABS
ORAL_TABLET | ORAL | Status: AC
  Filled 2020-11-17: qty 2

## 2020-11-17 MED ORDER — MIDAZOLAM HCL 2 MG/2ML IJ SOLN
INTRAMUSCULAR | Status: AC
  Filled 2020-11-17: qty 2

## 2020-11-17 MED ORDER — OXYCODONE HCL 5 MG/5ML PO SOLN: 5.0000 mg | Freq: Once | ORAL | Status: DC | PRN

## 2020-11-17 MED ORDER — OXYCODONE HCL 5 MG PO TABS
5.0000 mg | ORAL_TABLET | Freq: Once | ORAL | Status: DC | PRN
Start: 2020-11-17 — End: 2020-11-17

## 2020-11-17 MED ORDER — LIDOCAINE HCL (CARDIAC) PF 100 MG/5ML IV SOSY
PREFILLED_SYRINGE | INTRAVENOUS | Status: DC | PRN
  Administered 2020-11-17: 60 mg via INTRAVENOUS

## 2020-11-17 MED ORDER — ACETAMINOPHEN 500 MG PO TABS
1000.0000 mg | ORAL_TABLET | ORAL | Status: AC
  Administered 2020-11-17: 500 mg via ORAL

## 2020-11-17 MED ORDER — DEXAMETHASONE SODIUM PHOSPHATE 4 MG/ML IJ SOLN
INTRAMUSCULAR | Status: DC | PRN
  Administered 2020-11-17: 10 mg via INTRAVENOUS

## 2020-11-17 MED ORDER — BACITRACIN ZINC 500 UNIT/GM EX OINT
TOPICAL_OINTMENT | CUTANEOUS | Status: DC | PRN
  Administered 2020-11-17: 1 via TOPICAL

## 2020-11-17 MED ORDER — FENTANYL CITRATE (PF) 100 MCG/2ML IJ SOLN
INTRAMUSCULAR | Status: DC | PRN
  Administered 2020-11-17: 100 ug via INTRAVENOUS

## 2020-11-17 MED ORDER — CIPROFLOXACIN IN D5W 400 MG/200ML IV SOLN
400.0000 mg | INTRAVENOUS | Status: AC
  Administered 2020-11-17: 400 mg via INTRAVENOUS

## 2020-11-17 MED ORDER — AMISULPRIDE (ANTIEMETIC) 5 MG/2ML IV SOLN: 10.0000 mg | Freq: Once | INTRAVENOUS | Status: DC | PRN

## 2020-11-17 SURGICAL SUPPLY — 40 items
ADH SKN CLS APL DERMABOND .7 (GAUZE/BANDAGES/DRESSINGS) ×1
APL PRP STRL LF DISP 70% ISPRP (MISCELLANEOUS) ×1
BINDER BREAST XLRG (GAUZE/BANDAGES/DRESSINGS) IMPLANT
BINDER BREAST XXLRG (GAUZE/BANDAGES/DRESSINGS) IMPLANT
BLADE SURG 15 STRL LF DISP TIS (BLADE) ×1 IMPLANT
BLADE SURG 15 STRL SS (BLADE) ×2
CHLORAPREP W/TINT 26 (MISCELLANEOUS) ×2 IMPLANT
COVER BACK TABLE 60X90IN (DRAPES) ×2 IMPLANT
COVER MAYO STAND STRL (DRAPES) ×2 IMPLANT
COVER PROBE W GEL 5X96 (DRAPES) ×2 IMPLANT
DERMABOND ADVANCED (GAUZE/BANDAGES/DRESSINGS) ×1
DERMABOND ADVANCED .7 DNX12 (GAUZE/BANDAGES/DRESSINGS) ×1 IMPLANT
DRAPE GAMMA PROBE CRDLSS 10X38 (DRAPES) ×1 IMPLANT
DRAPE LAPAROSCOPIC ABDOMINAL (DRAPES) ×2 IMPLANT
DRAPE UTILITY XL STRL (DRAPES) ×2 IMPLANT
DRSG TEGADERM 4X4.75 (GAUZE/BANDAGES/DRESSINGS) ×1 IMPLANT
ELECT COATED BLADE 2.86 ST (ELECTRODE) ×2 IMPLANT
ELECT REM PT RETURN 9FT ADLT (ELECTROSURGICAL) ×2
ELECTRODE REM PT RTRN 9FT ADLT (ELECTROSURGICAL) ×1 IMPLANT
GAUZE SPONGE 4X4 12PLY STRL LF (GAUZE/BANDAGES/DRESSINGS) ×1 IMPLANT
GLOVE SURG ENC MOIS LTX SZ6.5 (GLOVE) ×1 IMPLANT
GLOVE SURG ENC MOIS LTX SZ7 (GLOVE) ×4 IMPLANT
GLOVE SURG UNDER POLY LF SZ7 (GLOVE) ×1 IMPLANT
GLOVE SURG UNDER POLY LF SZ7.5 (GLOVE) ×2 IMPLANT
GOWN STRL REUS W/ TWL LRG LVL3 (GOWN DISPOSABLE) ×2 IMPLANT
GOWN STRL REUS W/TWL LRG LVL3 (GOWN DISPOSABLE) ×4
KIT MARKER MARGIN INK (KITS) ×2 IMPLANT
NDL HYPO 25X1 1.5 SAFETY (NEEDLE) ×1 IMPLANT
NEEDLE HYPO 25X1 1.5 SAFETY (NEEDLE) ×2 IMPLANT
PACK BASIN DAY SURGERY FS (CUSTOM PROCEDURE TRAY) ×2 IMPLANT
PENCIL SMOKE EVACUATOR (MISCELLANEOUS) ×2 IMPLANT
SLEEVE SCD COMPRESS KNEE MED (STOCKING) ×2 IMPLANT
SPONGE LAP 4X18 RFD (DISPOSABLE) ×2 IMPLANT
STRIP CLOSURE SKIN 1/2X4 (GAUZE/BANDAGES/DRESSINGS) ×2 IMPLANT
SUT ETHILON 3 0 PS 1 (SUTURE) ×1 IMPLANT
SUT SILK 2 0 SH (SUTURE) ×1 IMPLANT
SYR CONTROL 10ML LL (SYRINGE) ×2 IMPLANT
TOWEL GREEN STERILE FF (TOWEL DISPOSABLE) ×2 IMPLANT
TRAY FAXITRON CT DISP (TRAY / TRAY PROCEDURE) ×2 IMPLANT
TUBE CONNECTING 20X1/4 (TUBING) IMPLANT

## 2020-11-17 NOTE — Discharge Instructions (Signed)
[facility] Office Phone Number [phone]  BREAST BIOPSY/ PARTIAL MASTECTOMY: POST OP INSTRUCTIONS Take 400 mg of ibuprofen every 8 hours or 650 mg tylenol every 6 hours for next 72 hours then as needed. Use ice several times daily also. Always review your discharge instruction sheet given to you by the facility where your surgery was performed.  IF YOU HAVE DISABILITY OR FAMILY LEAVE FORMS, YOU MUST BRING THEM TO THE OFFICE FOR PROCESSING.  DO NOT GIVE THEM TO YOUR DOCTOR.  1. A prescription for pain medication may be given to you upon discharge.  Take your pain medication as prescribed, if needed.  If narcotic pain medicine is not needed, then you may take acetaminophen (Tylenol), naprosyn (Alleve) or ibuprofen (Advil) as needed. 2. Take your usually prescribed medications unless otherwise directed 3. If you need a refill on your pain medication, please contact your pharmacy.  They will contact our office to request authorization.  Prescriptions will not be filled after 5pm or on week-ends. 4. You should eat very light the first 24 hours after surgery, such as soup, crackers, pudding, etc.  Resume your normal diet the day after surgery. 5. Most patients will experience some swelling and bruising in the breast.  Ice packs and a good support bra will help.  Wear the breast binder provided or a sports bra for 72 hours day and night.  After that wear a sports bra during the day until you return to the office. Swelling and bruising can take several days to resolve.  6. It is common to experience some constipation if taking pain medication after surgery.  Increasing fluid intake and taking a stool softener will usually help or prevent this problem from occurring.  A mild laxative (Milk of Magnesia or Miralax) should be taken according to package directions if there are no bowel movements after 48 hours. 7. Unless discharge instructions indicate otherwise, you may remove your bandages 48  hours after surgery and you may shower at that time.  You may have steri-strips (small skin tapes) in place directly over the incision.  These strips should be left on the skin for 7-10 days and will come off on their own.  If your surgeon used skin glue on the incision, you may shower in 24 hours.  The glue will flake off over the next 2-3 weeks.  Any sutures or staples will be removed at the office during your follow-up visit. 8. ACTIVITIES:  You may resume regular daily activities (gradually increasing) beginning the next day.  Wearing a good support bra or sports bra minimizes pain and swelling.  You may have sexual intercourse when it is comfortable. a. You may drive when you no longer are taking prescription pain medication, you can comfortably wear a seatbelt, and you can safely maneuver your car and apply brakes. b. RETURN TO WORK:  ______________________________________________________________________________________ 9. You should see your doctor in the office for a follow-up appointment approximately two weeks after your surgery.  Your doctor's nurse will typically make your follow-up appointment when she calls you with your pathology report.  Expect your pathology report 3-4 business days after your surgery.  You may call to check if you do not hear from us after three days. 10. OTHER INSTRUCTIONS: _______________________________________________________________________________________________ _____________________________________________________________________________________________________________________________________ _____________________________________________________________________________________________________________________________________ _____________________________________________________________________________________________________________________________________  WHEN TO CALL DR WAKEFIELD: 1. Fever over 101.0 2. Nausea and/or vomiting. 3. Extreme swelling or  bruising. 4. Continued bleeding from incision. 5. Increased pain, redness, or drainage from the incision.  The clinic   staff is available to answer your questions during regular business hours.  Please don't hesitate to call and ask to speak to one of the nurses for clinical concerns.  If you have a medical emergency, go to the nearest emergency room or call 911.  A surgeon from [facility] is always on call at the hospital.  For further questions, please visit [email or web address]   Post Anesthesia Home Care Instructions  Activity: Get plenty of rest for the remainder of the day. A responsible individual must stay with you for 24 hours following the procedure.  For the next 24 hours, DO NOT: -Drive a car -Operate machinery -Drink alcoholic beverages -Take any medication unless instructed by your physician -Make any legal decisions or sign important papers.  Meals: Start with liquid foods such as gelatin or soup. Progress to regular foods as tolerated. Avoid greasy, spicy, heavy foods. If nausea and/or vomiting occur, drink only clear liquids until the nausea and/or vomiting subsides. Call your physician if vomiting continues.  Special Instructions/Symptoms: Your throat may feel dry or sore from the anesthesia or the breathing tube placed in your throat during surgery. If this causes discomfort, gargle with warm salt water. The discomfort should disappear within 24 hours.  If you had a scopolamine patch placed behind your ear for the management of post- operative nausea and/or vomiting:  1. The medication in the patch is effective for 72 hours, after which it should be removed.  Wrap patch in a tissue and discard in the trash. Wash hands thoroughly with soap and water. 2. You may remove the patch earlier than 72 hours if you experience unpleasant side effects which may include dry mouth, dizziness or visual disturbances. 3. Avoid touching the patch. Wash your hands  with soap and water after contact with the patch.      

## 2020-11-17 NOTE — Op Note (Addendum)
Preoperative diagnosis:left breast calcifications times two, core biopsies with ADH Postoperative diagnosis: Same as above Procedure:Leftbreast radioactive seed bracketed excisional biopsy Surgeon: Dr. Harden Mo Anesthesia: General Specimens:Leftbreast tissue marked with paint containingclips, seeds Drains: None Complications: None Estimated blood loss: Minimal Sponge and count was correct completion Disposition to recovery stable condition  Indications:69 yof with fh of breast cancer in her sister who presents after screening mm (none in ten years). she was noted to have b density breasts. she has right and left breast calcs with possible left asymmetry. additional views show on left two groups of calcs with larger being 2.2x1.9x0.9 cm and the other being 5x4x33mm. there is a 3 mm area on right. the two left sided calcs were biopsied first and are ADH. the right side was then biopsied and are fcc. we discussed seed guided excisional biopsies.  She states is allergic to absorbable suture and does not want any. I confirmed with her prior to surgery that silk sutures and nylon were fine.   Procedure: After informed consent was obtained the patient was taken to the operating room. She had seeds placed prior to beginning. I had these mammograms available in the operating room. She was given antibiotics. SCDs were placed. She was placed under general anesthesia without complication. She was prepped and draped in the standard sterile surgical fashion. Surgical timeout was then performed.  I located the seedin thelateral left breast. Iinfiltrated Marcaine around this area first. I made incision overlying the seeds and dissected to the seed. I then used the neoprobe to guide excision of the seeds. Mammogram confirmed removal of the seedsand the clips. Hemostasis was obtained. I closed the breast tissue with 2-0 silk suture. The skin was closed with 3-0 nylon suture.   Bacitracin and a sterile dressing were placed. She tolerated this well was extubated and transferred to recovery stable.

## 2020-11-17 NOTE — Anesthesia Preprocedure Evaluation (Addendum)
Anesthesia Evaluation  Patient identified by MRN, date of birth, ID band Patient awake    Reviewed: Allergy & Precautions, NPO status , Patient's Chart, lab work & pertinent test results  Airway Mallampati: I  TM Distance: >3 FB Neck ROM: Full   Comment: For spine surgery in 09/2019: Grade 1 view with MAC 3; no glidescope needed. Dental no notable dental hx.    Pulmonary Current Smoker,    Pulmonary exam normal        Cardiovascular METS: 3 - Mets Normal cardiovascular exam+ dysrhythmias (occasional PVC's)  Rhythm:Regular Rate:Normal  Most recent EKG reviewed: SR with occas PVC's   Neuro/Psych negative psych ROS   GI/Hepatic Neg liver ROS, GERD  Medicated and Controlled,  Endo/Other  Hypothyroidism Obesity BMI 38  Renal/GU negative Renal ROS  negative genitourinary   Musculoskeletal negative musculoskeletal ROS (+)   Abdominal   Peds  Hematology   Anesthesia Other Findings + sinus drainage/postnasal drip  Reproductive/Obstetrics negative OB ROS                          Anesthesia Physical  Anesthesia Plan  ASA: II  Anesthesia Plan: General   Post-op Pain Management:    Induction: Intravenous  PONV Risk Score and Plan: 2 and Ondansetron and Midazolam  Airway Management Planned: LMA  Additional Equipment:   Intra-op Plan:   Post-operative Plan: Extubation in OR  Informed Consent: I have reviewed the patients History and Physical, chart, labs and discussed the procedure including the risks, benefits and alternatives for the proposed anesthesia with the patient or authorized representative who has indicated his/her understanding and acceptance.     Dental advisory given  Plan Discussed with: Anesthesiologist, CRNA and Surgeon  Anesthesia Plan Comments:       Anesthesia Quick Evaluation

## 2020-11-17 NOTE — Interval H&P Note (Signed)
History and Physical Interval Note:  11/17/2020 10:52 AM  [name]  has presented today for surgery, with the diagnosis of LEFT BREAST ADH.  The various methods of treatment have been discussed with the patient and family. After consideration of risks, benefits and other options for treatment, the patient has consented to  Procedure(s) with comments: LEFT BREAST SEED GUIDED EXCISIONAL BIOPSY X 2 (Left) - START TIME OF 11:00 AM FOR 60 MINUTES IN ROOM 8 as a surgical intervention.  The patient's history has been reviewed, patient examined, no change in status, stable for surgery.  I have reviewed the patient's chart and labs.  Questions were answered to the patient's satisfaction.     Angela Scott

## 2020-11-17 NOTE — Anesthesia Procedure Notes (Signed)
Procedure Name: LMA Insertion Performed by: Ronnette Hila, CRNA Pre-anesthesia Checklist: Patient identified, Emergency Drugs available, Suction available and Patient being monitored Patient Re-evaluated:Patient Re-evaluated prior to induction Oxygen Delivery Method: Circle System Utilized Preoxygenation: Pre-oxygenation with 100% oxygen Induction Type: IV induction Ventilation: Mask ventilation without difficulty LMA: LMA inserted LMA Size: 4.0 Number of attempts: 1 Airway Equipment and Method: Bite block Placement Confirmation: positive ETCO2 Tube secured with: Tape Dental Injury: Teeth and Oropharynx as per pre-operative assessment

## 2020-11-17 NOTE — Anesthesia Postprocedure Evaluation (Signed)
Anesthesia Post Note  Patient: Angela Scott  Procedure(s) Performed: LEFT BREAST SEED GUIDED EXCISIONAL BIOPSY X 2 (Left Breast)     Patient location during evaluation: PACU Anesthesia Type: General Level of consciousness: awake Pain management: pain level controlled Vital Signs Assessment: post-procedure vital signs reviewed and stable Respiratory status: spontaneous breathing and respiratory function stable Cardiovascular status: stable Postop Assessment: no apparent nausea or vomiting Anesthetic complications: no   No complications documented.  Last Vitals:  Vitals:   11/17/20 1204 11/17/20 1215  BP:  124/84  Pulse: 71 68  Resp: 15 17  Temp: 36.5 C   SpO2: 98% 100%    Last Pain:  Vitals:   11/17/20 1215  TempSrc:   PainSc: 0-No pain                 Mellody Dance

## 2020-11-17 NOTE — Transfer of Care (Signed)
Immediate Anesthesia Transfer of Care Note  Patient: Angela Scott  Procedure(s) Performed: LEFT BREAST SEED GUIDED EXCISIONAL BIOPSY X 2 (Left Breast)  Patient Location: PACU  Anesthesia Type:General  Level of Consciousness: drowsy  Airway & Oxygen Therapy: Patient Spontanous Breathing and Patient connected to face mask oxygen  Post-op Assessment: Report given to RN and Post -op Vital signs reviewed and stable  Post vital signs: Reviewed and stable  Last Vitals:  Vitals Value Taken Time  BP 147/82 11/17/20 1203  Temp    Pulse 70 11/17/20 1204  Resp 16 11/17/20 1204  SpO2 98 % 11/17/20 1204  Vitals shown include unvalidated device data.  Last Pain:  Vitals:   11/17/20 0943  TempSrc: Oral  PainSc: 2       Patients Stated Pain Goal: 5 (11/17/20 0943)  Complications: No complications documented.

## 2020-11-17 NOTE — H&P (Signed)
19 yof with fh of breast cancer in her sister who presents after screening mm (none in ten years). she has no mass or dc. she was noted to have b density breasts. she has right and left breast calcs with possible left asymmetry. additional views show on left two groups of calcs with larger being 2.2x1.9x0.9 cm and the other being 5x4x89mm. there is a 3 mm area on right. the two left sided calcs were biopsied first and are ADH. the right side was then biopsied and are fcc. she is here to discuss options today she is retired Education officer, community  Past Surgical History Breast Biopsy  Bilateral. Foot Surgery  Right. Knee Surgery  Bilateral. Lap Band  Oral Surgery  Spinal Surgery - Lower Back  Tonsillectomy    Colonoscopy  never Mammogram  within last year 1-3 years ago Pap Smear  >5 years ago 1-5 years ago  Allergies Clindamycin HCl (Bulk) *CHEMICALS*  No Known Drug Allergies :  Methotrexate (Anti-Rheumatic) *ANALGESICS - ANTI-INFLAMMATORY*  Augmentin *PENICILLINS*   Medication History  Medications Reconciled  Social History  Alcohol use  Occasional alcohol use. Caffeine use  Coffee. No drug use  Tobacco use  Current some day smoker.  Family History  Arthritis  Mother, Sister. Breast Cancer  Family Members In General, Sister. Cancer  Father, Sister. Seizure disorder  Father. Thyroid problems  Mother.  Pregnancy / Birth History  Age at menarche  12 years. Age of menopause  [age] 54-56 Contraceptive History  Contraceptive implant, Intrauterine device, Oral contraceptives. Gravida  0 Para  0  Other Problems  Back Pain  Gastroesophageal Reflux Disease  General anesthesia - complications  Other disease, cancer, significant illness  Thyroid Disease  Transfusion history    Review of Systems  General Present- Fatigue and Weight Gain. Not Present- Appetite Loss, Chills, Fever, Night Sweats and Weight Loss. HEENT Present- Seasonal Allergies and  Wears glasses/contact lenses. Not Present- Earache, Hearing Loss, Hoarseness, Nose Bleed, Oral Ulcers, Ringing in the Ears, Sinus Pain, Sore Throat, Visual Disturbances and Yellow Eyes. Breast Present- Breast Pain. Not Present- Breast Mass, Nipple Discharge and Skin Changes. Cardiovascular Present- Palpitations. Not Present- Chest Pain, Difficulty Breathing Lying Down, Leg Cramps, Rapid Heart Rate, Shortness of Breath and Swelling of Extremities. Gastrointestinal Present- Excessive gas. Not Present- Abdominal Pain, Bloating, Bloody Stool, Change in Bowel Habits, Chronic diarrhea, Constipation, Difficulty Swallowing, Gets full quickly at meals, Hemorrhoids, Indigestion, Nausea, Rectal Pain and Vomiting. Musculoskeletal Present- Joint Pain and Muscle Weakness. Not Present- Back Pain, Joint Stiffness, Muscle Pain and Swelling of Extremities. Neurological Present- Numbness, Trouble walking and Weakness. Not Present- Decreased Memory, Fainting, Headaches, Seizures, Tingling and Tremor. Endocrine Present- Cold Intolerance. Not Present- Excessive Hunger, Hair Changes, Heat Intolerance, Hot flashes and New Diabetes.   Physical Exam  General Mental Status-Alert. Orientation-Oriented X3. Breast Nipples-No Discharge. Breast Lump-No Palpable Breast Mass. Lymphatic Head & Neck General Head & Neck Lymphatics: Bilateral - Description - Normal. Axillary General Axillary Region: Bilateral - Description - Normal. Note: no Weston adenopathy  Assessment & Plan  ATYPICAL DUCTAL HYPERPLASIA OF LEFT BREAST (N60.92) Story: Left breast seed guided excisional biopsy times two I discussed adh and we calculated risk profile today. her lifetime risk is over 26% by TC and by Dondra Spry is 30%. we will discuss this further after below I recommended excision by seed guidance of both these areas of adh due to possibility of sampling error and that there could be something more now. discussed seed guided excisional biopsy  with  risks, recovery

## 2020-11-18 ENCOUNTER — Encounter (HOSPITAL_BASED_OUTPATIENT_CLINIC_OR_DEPARTMENT_OTHER): Payer: Self-pay | Admitting: General Surgery

## 2020-11-19 LAB — SURGICAL PATHOLOGY

## 2020-11-20 ENCOUNTER — Encounter: Payer: Self-pay | Admitting: Family Medicine

## 2020-11-20 NOTE — Telephone Encounter (Signed)
I called patient and offered to give her some other  offices to see if she can be seen today to evaluate her, patient refused, saying that she just need a Zpak.  I informed pt that I would send her message back to the doc of the day, as Dr. Barron Alvine is OOO today.  Pt agreed with that option.  Please advise.

## 2020-11-20 NOTE — Telephone Encounter (Signed)
Please see previous message that patient sent regarding her symptoms. Offered appointment, but patient wants to know if she can have something sent in. She states that she's usually treated with a Z-pak. She does not want to do a virtual, please advise if an "in office" visit is okay. She said that she did test negative for COVID. Please call her back at [phone] and advise.

## 2021-04-14 ENCOUNTER — Telehealth: Payer: Self-pay

## 2021-04-14 DIAGNOSIS — E039 Hypothyroidism, unspecified: Secondary | ICD-10-CM

## 2021-04-14 NOTE — Telephone Encounter (Signed)
Spoke to patient, she states that Dr Daphine Deutscher had filled the Synthroid 25 mcg 3 months ago.  Not seeing this on her medication list.  Advised that Dr Veto Kemps usually doesn't fill medications prior to seeing them and she said" okay thank you" and hung up.  Dm/cma

## 2021-04-14 NOTE — Telephone Encounter (Signed)
Requesting a refill for Synthroid to be sent to [facility]. Dr C patient transferring to Dr Veto Kemps. Appt isn't until October. I don't see where Dr C prescribed this for her but patient said Dr C was supposed to before she left. Can we refill this?  Call back # [phone]

## 2021-05-06 ENCOUNTER — Other Ambulatory Visit: Payer: Self-pay | Admitting: Family Medicine

## 2021-05-11 ENCOUNTER — Other Ambulatory Visit: Payer: Self-pay | Admitting: Family

## 2021-05-11 MED ORDER — LEVOTHYROXINE SODIUM 200 MCG PO TABS: 200.0000 ug | ORAL_TABLET | Freq: Every day | ORAL | 0 refills | Status: DC

## 2021-05-11 NOTE — Telephone Encounter (Signed)
Patient calling again for refill of Synthroid. She states her energy level is very low. Patient wonders if she could have labs done for thyroid level and then get bridge refill. Please call patient to advise.

## 2021-05-11 NOTE — Telephone Encounter (Signed)
Please advise on messages.   Thanks. Dm/cma

## 2021-05-12 ENCOUNTER — Other Ambulatory Visit: Payer: Self-pay

## 2021-05-12 ENCOUNTER — Other Ambulatory Visit: Payer: Self-pay | Admitting: Family

## 2021-05-12 ENCOUNTER — Other Ambulatory Visit (INDEPENDENT_AMBULATORY_CARE_PROVIDER_SITE_OTHER): Payer: Medicare Other

## 2021-05-12 DIAGNOSIS — E039 Hypothyroidism, unspecified: Secondary | ICD-10-CM

## 2021-05-12 LAB — TSH: TSH: 0.19 u[IU]/mL — ABNORMAL LOW (ref 0.35–5.50)

## 2021-05-12 NOTE — Telephone Encounter (Signed)
Spoke to patient and advise that she would need to come in for some blood work.  Scheduled her for 11:20 today and updated her med list.    Order entered in the computer.  Dm/cma

## 2021-05-12 NOTE — Addendum Note (Signed)
Addended by: Waymond Cera on: 05/12/2021 10:54 AM   Modules accepted: Orders

## 2021-05-12 NOTE — Telephone Encounter (Signed)
Patient notified VIA phone. Dm/cma  

## 2021-05-13 ENCOUNTER — Telehealth: Payer: Self-pay | Admitting: Family Medicine

## 2021-05-13 ENCOUNTER — Other Ambulatory Visit: Payer: Self-pay | Admitting: Family

## 2021-05-13 MED ORDER — SYNTHROID 25 MCG PO TABS: 25.0000 ug | ORAL_TABLET | Freq: Every day | ORAL | 1 refills | Status: DC

## 2021-05-13 NOTE — Telephone Encounter (Signed)
Dr Hyman Hopes entered in 200 MCG of Synthroid, instead of 25 MCG... Pt requesting this be fixed so she can pickup the correct Rx.

## 2021-06-05 ENCOUNTER — Telehealth: Payer: Medicare Other | Admitting: Nurse Practitioner

## 2021-06-05 ENCOUNTER — Telehealth: Payer: Self-pay | Admitting: Family Medicine

## 2021-06-05 DIAGNOSIS — U071 COVID-19: Secondary | ICD-10-CM | POA: Diagnosis not present

## 2021-06-05 MED ORDER — MOLNUPIRAVIR EUA 200MG CAPSULE: 4.0000 | ORAL_CAPSULE | Freq: Two times a day (BID) | ORAL | 0 refills | Status: AC

## 2021-06-05 NOTE — Telephone Encounter (Signed)
Call received from access nurse. The patient tested positive for COVID today and symptoms of congestion and runny nose started yesterday. No CP or SOB. Given risk factors I advised that the patient be seen for this either with a virtual visit or at an urgent care/walk in clinic to determine appropriate treatment. The nurse will relay this to the patient.

## 2021-06-05 NOTE — Progress Notes (Signed)
Virtual Visit Consent   Angela Scott, you are scheduled for a virtual visit with Mary-Margaret Daphine Deutscher, FNP, a [facility] provider, today.     Just as with appointments in the office, your consent must be obtained to participate.  Your consent will be active for this visit and any virtual visit you may have with one of our providers in the next 365 days.     If you have a MyChart account, a copy of this consent can be sent to you electronically.  All virtual visits are billed to your insurance company just like a traditional visit in the office.    As this is a virtual visit, video technology does not allow for your provider to perform a traditional examination.  This may limit your provider's ability to fully assess your condition.  If your provider identifies any concerns that need to be evaluated in person or the need to arrange testing (such as labs, EKG, etc.), we will make arrangements to do so.     Although advances in technology are sophisticated, we cannot ensure that it will always work on either your end or our end.  If the connection with a video visit is poor, the visit may have to be switched to a telephone visit.  With either a video or telephone visit, we are not always able to ensure that we have a secure connection.     I need to obtain your verbal consent now.   Are you willing to proceed with your visit today? YES   Angela Scott has provided verbal consent on 06/05/2021 for a virtual visit (video or telephone).   Mary-Margaret Daphine Deutscher, FNP   Date: 06/05/2021 6:24 PM   Virtual Visit via Video Note   I, Mary-Margaret Daphine Deutscher, connected with Angela Scott ([id number], 11-06-1950) on 06/05/21 at  6:45 PM EDT by a video-enabled telemedicine application and verified that I am speaking with the correct person using two identifiers.  Location: Patient: Virtual Visit Location Patient: Home Provider: Virtual Visit Location Provider: Mobile   I discussed the limitations  of evaluation and management by telemedicine and the availability of in person appointments. The patient expressed understanding and agreed to proceed.    History of Present Illness: Angela Scott is a 70 y.o. who identifies as a female who was assigned female at birth, and is being seen today for covid positive.  HPI: Patient says that she devloped a bad headache during the night. She wokeup and took a  muscle relaxor which helped a little and was able to go back to sleep. She woke up this morning with body aches and cough. Did covid test and was positive. She has taken tylenol today.    Review of Systems  Constitutional:  Positive for chills and fever (99.5).  HENT:  Positive for congestion.   Respiratory:  Positive for cough and sputum production. Negative for shortness of breath.   Musculoskeletal:  Positive for myalgias.  Neurological:  Positive for headaches. Negative for dizziness.    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    Pneumococcal Vaccines     Patient states she is allergic to all vaccines due to her lupus   Medications:  Current Outpatient Medications:    ibuprofen (ADVIL) 400 MG tablet, Take 400 mg by mouth as needed., Disp: , Rfl:    liothyronine (CYTOMEL) 50 MCG tablet, Take 50 mcg by mouth every other day., Disp: , Rfl:    Multiple Vitamin (MULTIVITAMIN WITH MINERALS) TABS tablet, Take 1 tablet by mouth daily., Disp: , Rfl:     SYNTHROID 25 MCG tablet, Take 1 tablet (25 mcg total) by mouth daily., Disp: 30 tablet, Rfl: 1  Observations/Objective: Patient is well-developed, well-nourished in no acute distress.  Resting comfortably  at home.  Head is normocephalic, atraumatic.  No labored breathing.  Speech is clear and coherent with logical content.  Patient is alert and oriented at baseline.  Voice hoarse Cough noted  Assessment and Plan:  Angela Scott in today with chief complaint of No chief complaint on file.   1. COVID-19 virus RNA test result positive at limit of detection 1. Take meds as prescribed 2. Use a cool mist humidifier especially during the winter months and when heat has been humid. 3. Use saline nose sprays frequently 4. Saline irrigations of the nose can be very helpful if done frequently.  * 4X daily for 1 week*  * Use of a nettie pot can be helpful with this. Follow directions with this* 5. Drink plenty of fluids 6. Keep thermostat turn down low 7.For any cough or congestion  Use plain Mucinex- regular strength or max strength is fine   * Children- consult with Pharmacist for dosing 8. For fever or aces or pains- take tylenol or ibuprofen appropriate for age and weight.  * for fevers greater than 101 orally you may alternate ibuprofen and tylenol every  3 hours.   Meds ordered this encounter  Medications   molnupiravir EUA (LAGEVRIO) 200 mg CAPS capsule    Sig: Take 4 capsules (800 mg total) by mouth 2 (two) times daily for 5 days.    Dispense:  40 capsule    Refill:  0    Order Specific Question:   Supervising Provider    Answer:   Hyacinth Meeker, BRIAN [[id number]]   Quarantine 10 days since no vaccine.     Follow Up Instructions: I discussed the assessment and treatment plan with the patient. The patient was provided an opportunity to ask questions and all were answered. The patient agreed with the plan and demonstrated an understanding of the instructions.  A copy of instructions  were sent to the patient via MyChart.  The patient was advised to call back or seek an in-person evaluation if the symptoms worsen or if the condition fails to improve as anticipated.  Time:  I spent 12 minutes with the patient via telehealth technology discussing the above problems/concerns.    Mary-Margaret Daphine Deutscher, FNP

## 2021-06-05 NOTE — Patient Instructions (Signed)
You are being prescribed MOLNUPIRAVIR for COVID-19 infection.  ° ° °Please call the pharmacy or go through the drive through vs going inside if you are picking up the mediation yourself to prevent further spread. If prescribed to a [facility] affiliated pharmacy, a pharmacist will bring the medication out to your car. ° ° °ADMINISTRATION INSTRUCTIONS: °Take with or without food. Swallow the tablets whole. Don't chew, crush, or break the medications because it might not work as well ° °For each dose of the medication, you should be taking FOUR tablets at one time, TWICE a day  ° °Finish your full five-day course of Molnupiravir even if you feel better before you're done. Stopping this medication too early can make it less effective to prevent severe illness related to COVID19.   ° °Molnupiravir is prescribed for YOU ONLY. Don't share it with others, even if they have similar symptoms as you. This medication might not be right for everyone.  ° °Make sure to take steps to protect yourself and others while you're taking this medication in order to get well soon and to prevent others from getting sick with COVID-19. ° ° °**If you are of childbearing potential (any gender) - it is advised to not get pregnant while taking this medication and recommended that condoms are used for female partners the next 3 months after taking the medication out of extreme caution  ° ° °COMMON SIDE EFFECTS: °Diarrhea °Nausea  °Dizziness ° ° ° °If your COVID-19 symptoms get worse, get medical help right away. Call 911 if you experience symptoms such as worsening cough, trouble breathing, chest pain that doesn't go away, confusion, a hard time staying awake, and pale or blue-colored skin. °This medication won't prevent all COVID-19 cases from getting worse.  ° ° °

## 2021-06-18 ENCOUNTER — Other Ambulatory Visit: Payer: Self-pay

## 2021-06-21 ENCOUNTER — Encounter: Payer: Self-pay | Admitting: Family Medicine

## 2021-06-21 ENCOUNTER — Ambulatory Visit (INDEPENDENT_AMBULATORY_CARE_PROVIDER_SITE_OTHER): Payer: Medicare Other | Admitting: Family Medicine

## 2021-06-21 ENCOUNTER — Other Ambulatory Visit: Payer: Self-pay

## 2021-06-21 VITALS — BP 124/78 | HR 69 | Temp 97.6°F | Ht 65.0 in | Wt 201.9 lb

## 2021-06-21 DIAGNOSIS — F17200 Nicotine dependence, unspecified, uncomplicated: Secondary | ICD-10-CM

## 2021-06-21 DIAGNOSIS — N6092 Unspecified benign mammary dysplasia of left breast: Secondary | ICD-10-CM

## 2021-06-21 DIAGNOSIS — E039 Hypothyroidism, unspecified: Secondary | ICD-10-CM | POA: Diagnosis not present

## 2021-06-21 DIAGNOSIS — M5416 Radiculopathy, lumbar region: Secondary | ICD-10-CM

## 2021-06-21 NOTE — Progress Notes (Signed)
[facility] [address] [place] [address] Dept: [phone] Dept Fax: [phone]  Transfer of Care Office Visit  Subjective:    Patient ID: Angela Scott, female    DOB: 30-May-1951, 70 y.o..   MRN: [id number]  Chief Complaint  Patient presents with   Establish Care    [facility]- establish care.  C/o having bilateral ears feeling stuffed up x 2 weeks after being positive for covid 2 weeks ago.  Also having hand and feet peeling.      History of Present Illness:  Patient is in today to establish care. Dr. 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 She was born in [place]. She is a Media planner and notes her religion precludes the use of titles. She attended college at [employer] and dental school at [facility] [employer]. 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 She retired from Financial controller in 2020. She is single, has no children, but has two Dalmatians. She smokes 1/2 ppd of cigarettes, and has for ~ 45 years. She drinks alcohol socially and denies drug use.  Angela Scott has a history Graves disease. She underwent radiation to her thyroid, which then left her hypothyroid. She is currently managed on levothyroxine 25 mcg q am and liothyroxine 20 mcg q pm. She notes that she has had significant swings of her thyroid around the time of surgical procedures.  Angela Scott has a history of lower spinal issues, including spondylolisthesis and lumbar foraminal stenosis. She has had a lumbar spinal fusion and a laminectomy. She regrets having these surgeries. She notes she has persistent numbness in her left foot.  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 She had a severe reaction to methotrexate in the past. she notes she started using exercise as her main approach to managing much of her issues. However, with her back surgery last year and recently  having COVID, she has had some weight gain. She had a lap band procedure in 2007. She has thoguht about having this removed.  Angela Scott notes she is not in favor of most vaccines She does not want to pursue colon cancer screening, believing polyps should be left alone to work themselves out.  Angela Scott notes a prior abnormal mammogram. This led to biopsies of both breast. The right breast showed fibroglandular changes. The left breast biopsies showed atypical hyperplasia.   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   Past Surgical History:  Procedure Laterality Date   KNEE SURGERY Left    LAPAROSCOPIC GASTRIC BANDING WITH HIATAL HERNIA REPAIR     LUMBAR LAMINECTOMY     MANDIBLE SURGERY     MASS EXCISION Left 01/01/2015   Procedure: EXCISION MASS LEFT 1ST WEB SPACE;  Surgeon: Cindee Salt, MD;  Location: [facility];  Service: Orthopedics;  Laterality: Left;   RADIOACTIVE SEED GUIDED EXCISIONAL BREAST BIOPSY Left 11/17/2020   Procedure: LEFT BREAST SEED GUIDED EXCISIONAL BIOPSY X 2;  Surgeon: Emelia Loron, MD;  Location: [facility];  Service: General;  Laterality: Left;  SPINAL FUSION  09/2019   TONSILLECTOMY     Family History  Problem Relation Age of Onset   Brain cancer Father        Glioblastoma   Cancer Sister        Breast   Arrhythmia Brother        Ablation   Heart disease Maternal Grandfather    Cancer Maternal Grandfather        Colon   Outpatient Medications Prior to Visit  Medication Sig Dispense Refill   ibuprofen (ADVIL) 400 MG tablet Take 400 mg by mouth as needed.     liothyronine (CYTOMEL) 50  MCG tablet Take 50 mcg by mouth every other day.     Multiple Vitamin (MULTIVITAMIN WITH MINERALS) TABS tablet Take 1 tablet by mouth daily.     SYNTHROID 25 MCG tablet Take 1 tablet (25 mcg total) by mouth daily. 30 tablet 1   No facility-administered medications prior to visit.   Allergies  Allergen Reactions   Clindamycin/Lincomycin     C-Dif toxicity   Methotrexate Derivatives Anaphylaxis    Fever,lymphadnopathy   Augmentin [Amoxicillin-Pot Clavulanate] Nausea And Vomiting    Generic brands only   Other     absorbable sutures; Reports they do not absorb and create an abscess.    Pneumococcal Vaccines     Patient states she is allergic to all vaccines due to her lupus      Objective:   Today's Vitals   06/21/21 1409  BP: 124/78  Pulse: 69  Temp: 97.6 F (36.4 C)  TempSrc: Temporal  SpO2: 98%  Weight: 201 lb 14.4 oz (91.6 kg)  Height: 5\' 5"  (1.651 m)   Body mass index is 33.6 kg/m.   General: Well developed, well nourished. No acute distress. Psych: Alert and oriented. Normal mood and affect.  Health Maintenance Due  Topic Date Due   COVID-19 Vaccine (1) Never done   Hepatitis C Screening  Never done   TETANUS/TDAP  Never done   COLONOSCOPY (Pts 45-59yrs Insurance coverage will need to be confirmed)  Never done   Zoster Vaccines- Shingrix (1 of 2) Never done   INFLUENZA VACCINE  Never done   Lab Results Lab Results  Component Value Date   TSH 0.19 (L) 05/12/2021   Lab Results  Component Value Date   CHOL 161 06/19/2020   HDL 57 06/19/2020   LDLCALC 83 06/19/2020   TRIG 112 06/19/2020   CHOLHDL 2.8 06/19/2020   Lab Results  Component Value Date   HGBA1C 5.7 (H) 06/19/2020     Assessment & Plan:   1. Lumbar radiculopathy 06/21/2020 has stable chronic low back pain with some residual left radiculopathy. She is managing on PRN ibuprofen at this point. We will monitor for now.  2. Hypothyroidism, unspecified type Last TSH was low. Her Cytomel was  decreased. Recommend repeat at her next visit in March.  3. Atypical ductal hyperplasia of left breast She notes she will be due for a repeat mammogram in March 2023. I will see her back then.  4. Tobacco use disorder, mild, abuse I advised April 2023 to stop smoking. She is not willing to consider at this point.  Angela Ripper, MD

## 2021-08-23 IMAGING — MG DIGITAL SCREENING BILAT W/ TOMO W/ CAD
8 series · 8 of 24 positions shown · non-contrast
Comparison: None.

CLINICAL DATA: Screening.

EXAM:
DIGITAL SCREENING BILATERAL MAMMOGRAM WITH TOMO AND CAD

[R MLO synth-2D]
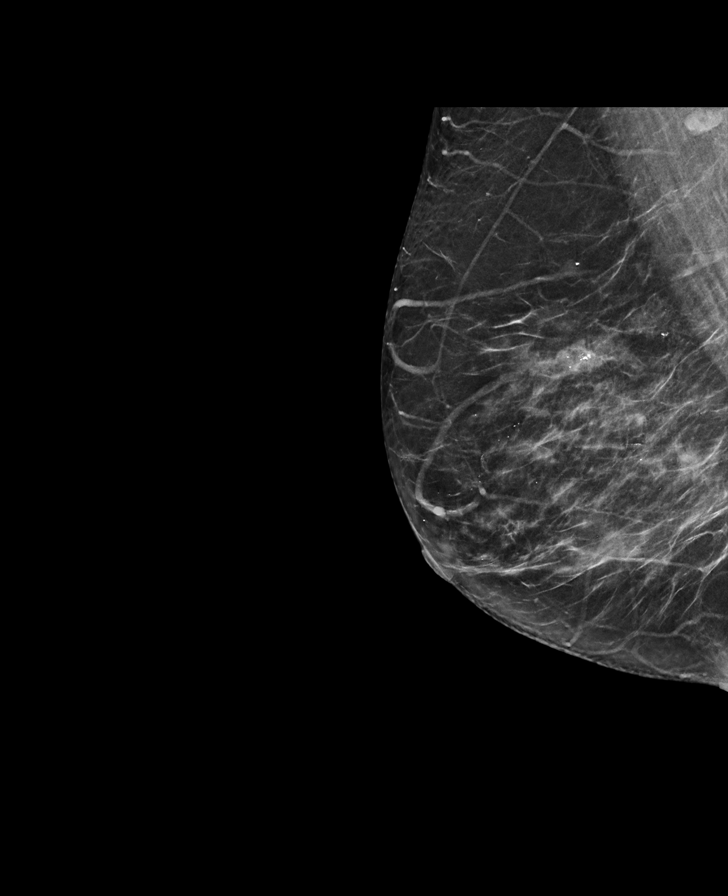

[L MLO synth-2D]
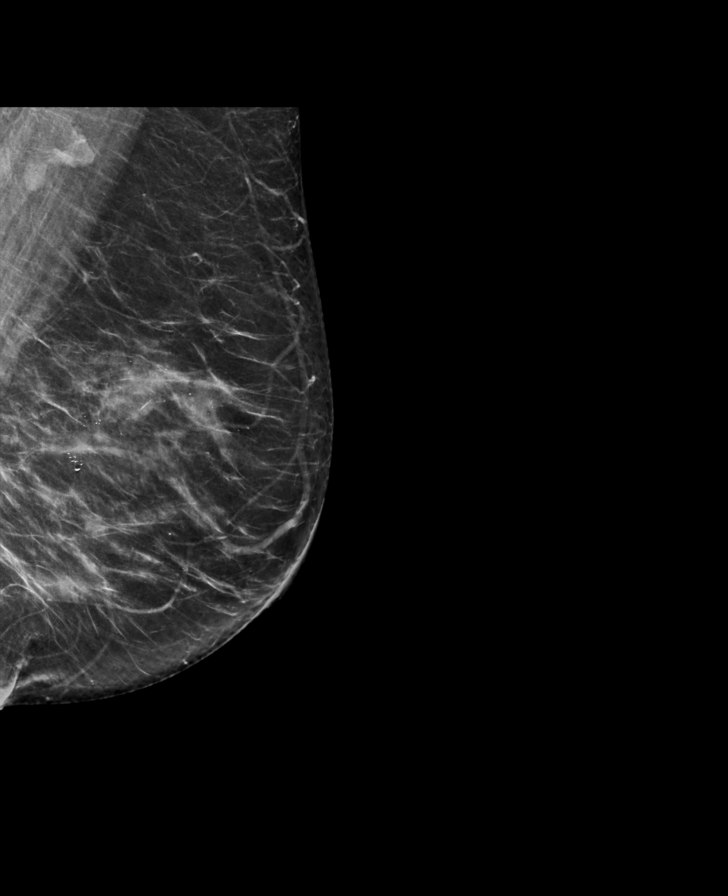

[L CC synth-2D]
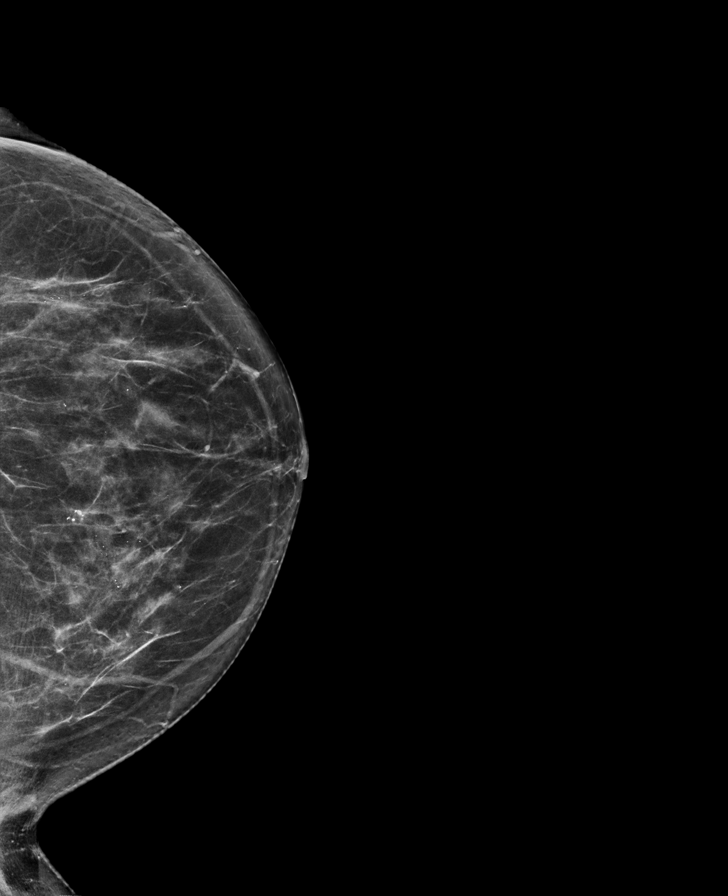

[R CC synth-2D]
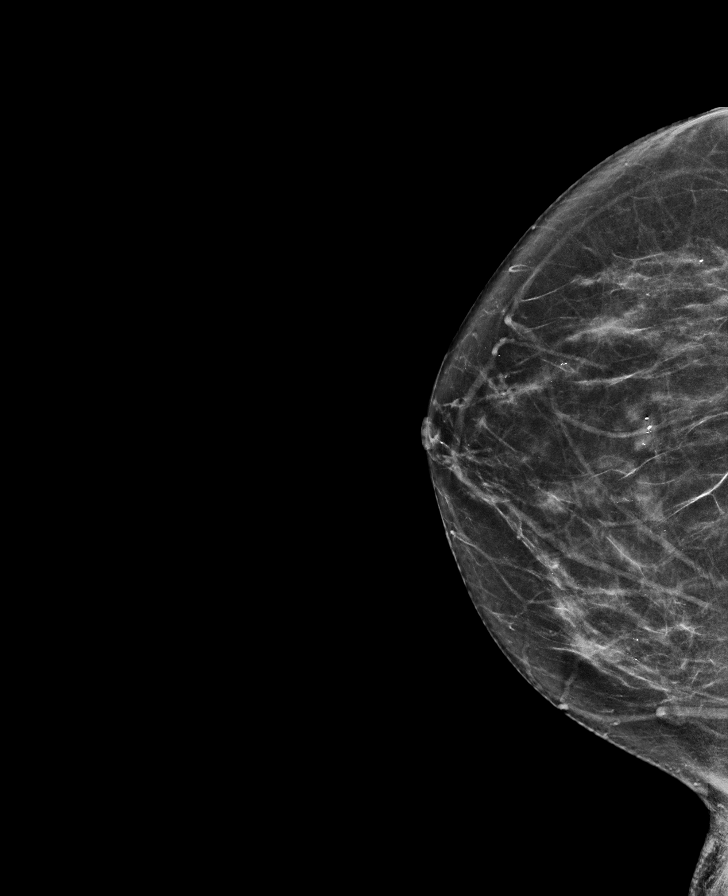

[R CC tomo · tomo slice 34/67.0]
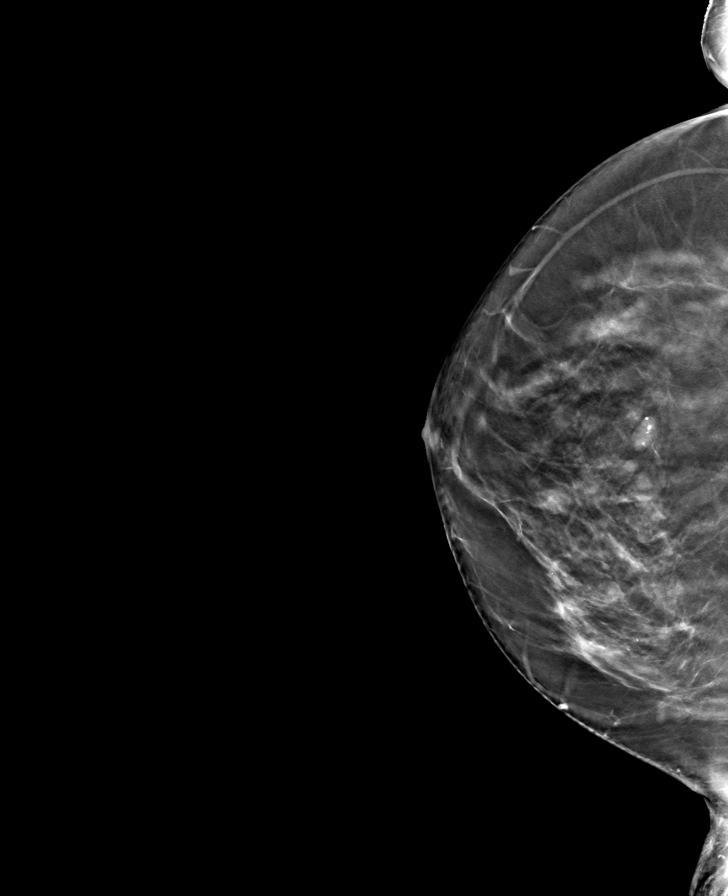

[L CC tomo · tomo slice 36/71.0]
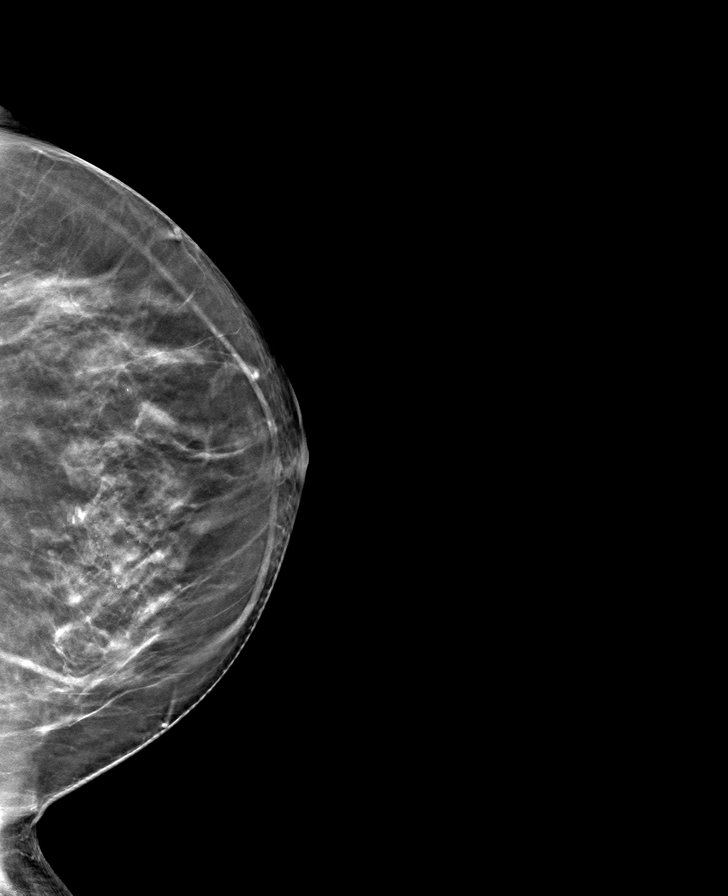

[R MLO tomo · tomo slice 35/68.0]
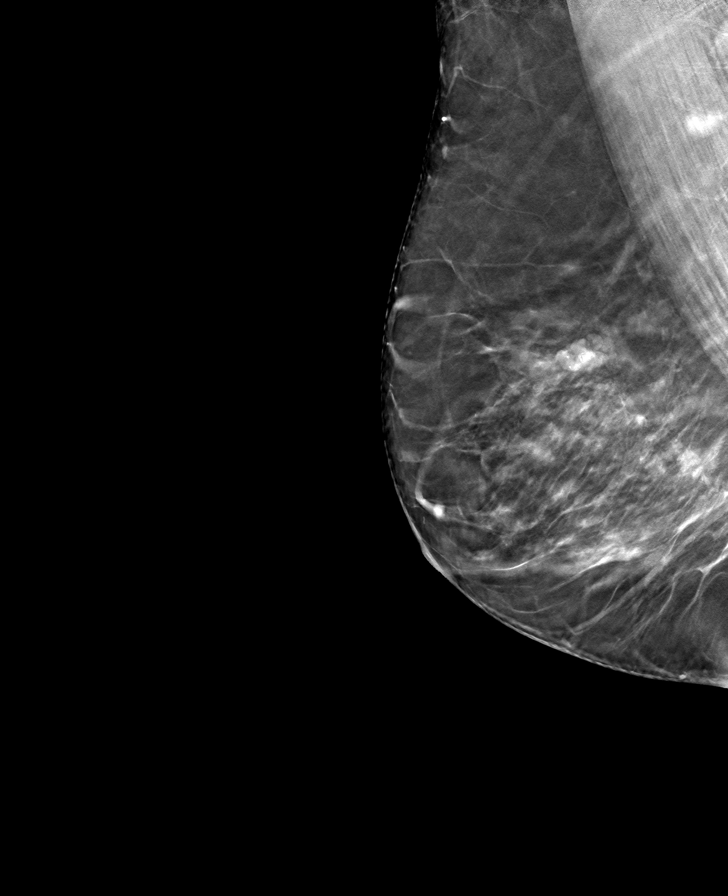

[L MLO tomo · tomo slice 37/74.0]
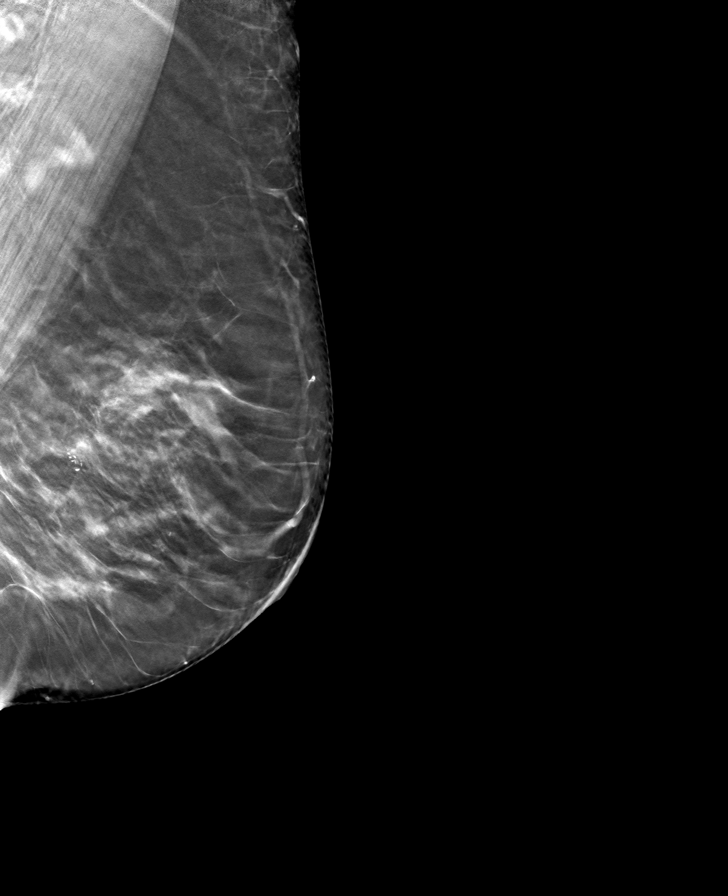

[8 of 24 positions shown; findings below may reference images not displayed]

ACR Breast Density Category b: There are scattered areas of
fibroglandular density.
FINDINGS: In the right breast , calcifications require further evaluation.

In the left breast , calcifications and a possible asymmetry
requires further evaluation. The possible asymmetry is seen within
the slightly lower LEFT breast, middle to posterior depth, MLO view
only, slice 41.

Images were processed with CAD.
IMPRESSION: Further evaluation is suggested for calcifications in the right
breast.

Further evaluation is suggested for calcifications and a possible
asymmetry in the left breast.

RECOMMENDATION:
Diagnostic mammogram and possibly ultrasound of both breasts.
(Code:[id number]-[id number])

The patient will be contacted regarding the findings, and additional
imaging will be scheduled.

BI-RADS CATEGORY  0: Incomplete. Need additional imaging evaluation
and/or prior mammograms for comparison.

## 2021-11-18 ENCOUNTER — Other Ambulatory Visit: Payer: Self-pay

## 2021-11-22 ENCOUNTER — Ambulatory Visit (INDEPENDENT_AMBULATORY_CARE_PROVIDER_SITE_OTHER): Payer: Medicare Other | Admitting: Family Medicine

## 2021-11-22 ENCOUNTER — Other Ambulatory Visit: Payer: Self-pay

## 2021-11-22 VITALS — BP 122/76 | HR 76 | Temp 97.0°F | Ht 65.0 in | Wt 203.0 lb

## 2021-11-22 DIAGNOSIS — Y842 Radiological procedure and radiotherapy as the cause of abnormal reaction of the patient, or of later complication, without mention of misadventure at the time of the procedure: Secondary | ICD-10-CM

## 2021-11-22 DIAGNOSIS — N6092 Unspecified benign mammary dysplasia of left breast: Secondary | ICD-10-CM | POA: Diagnosis not present

## 2021-11-22 DIAGNOSIS — E6609 Other obesity due to excess calories: Secondary | ICD-10-CM

## 2021-11-22 DIAGNOSIS — Z6833 Body mass index (BMI) 33.0-33.9, adult: Secondary | ICD-10-CM

## 2021-11-22 DIAGNOSIS — E890 Postprocedural hypothyroidism: Secondary | ICD-10-CM

## 2021-11-22 NOTE — Progress Notes (Signed)
?[facility] ?[facility] ?[address] [place] ?[place] [address] ?Dept: [phone] ?Dept Fax: [phone] ? ?Chronic Care Office Visit ? ?Subjective:  ? ? Patient ID: Angela Scott, female    DOB: 18-May-1951, 71 y.o..   MRN: [id number] ? ?Chief Complaint  ?Patient presents with  ? Follow-up  ?  5 month f/u.     ? ? ?History of Present Illness: ? ?Patient is in today for reassessment of chronic medical issues. ? ?Angela Scott has a history Graves disease. She underwent radiation to her thyroid, which then left her hypothyroid. She notes that in mid to late December, she had a sudden decrease in her energy level. She found herself with difficulty getting  out of bed. She was seen at [facility] by Dr. Niel Hummer (physiatry). She underwent a broad group of blood tests. This did show her TSH to be elevated at 40. Dr. Ace Gins increased Angela Scott's levothyroxine to 50 mcg q am and liothyroxine 37.5 mcg q pm (25 mcg 1 1/2 tabs).  ?  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 She has strong feelings about avoiding overmedicalizing this condition. She had surgery to remove this, but was not agreeable to other approaches, such as Tamoxifen. As well, she has not followed up for further mammograms. She is concerned about physicians "throwing darts" at the condition rather than have a specific idea of the risk and benefits. ? ?Past Medical History: ?Patient Active Problem List  ? Diagnosis Date Noted  ? Tobacco use disorder, mild, abuse 06/21/2021  ? Atypical ductal hyperplasia of left breast 06/21/2021  ? Lumbar foraminal stenosis 11/20/2019  ? Spondylolisthesis of lumbar region 09/26/2019  ? Spinal stenosis of lumbar region 09/11/2019  ? Lumbar radiculopathy 04/09/2019  ? H/O Graves' disease 07/16/2013  ? 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 GERD 02/05/2010  ? 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 ?Past Surgical History:  ?Procedure Laterality Date  ? KNEE SURGERY Left   ? LAPAROSCOPIC GASTRIC BANDING WITH HIATAL HERNIA REPAIR    ? LUMBAR LAMINECTOMY    ? MANDIBLE SURGERY    ? MASS EXCISION Left 01/01/2015  ? Procedure: EXCISION MASS LEFT 1ST WEB SPACE;  Surgeon: Daryll Brod, MD;  Location: [facility];  Service: Orthopedics;  Laterality: Left;  ? RADIOACTIVE SEED GUIDED EXCISIONAL BREAST BIOPSY Left 11/17/2020  ? Procedure: LEFT BREAST SEED GUIDED EXCISIONAL BIOPSY X 2;  Surgeon: Rolm Bookbinder, MD;  Location: [facility];  Service: General;  Laterality: Left;  ? SPINAL FUSION  09/2019  ? TONSILLECTOMY    ? ?Family History  ?Problem Relation Age of Onset  ? Brain cancer Father   ?     Glioblastoma  ? Cancer Sister   ?     Breast  ? Arrhythmia Brother   ?     Ablation  ? Heart disease Maternal Grandfather   ? Cancer Maternal Grandfather   ?     Colon  ? ?Outpatient Medications Prior to Visit  ?Medication Sig Dispense Refill  ? CYTOMEL 25 MCG tablet Take 37.5 mcg by mouth every morning. 1.5 daily    ? ibuprofen (ADVIL) 400 MG tablet Take 400 mg by mouth as needed.    ? Multiple Vitamin (MULTIVITAMIN WITH MINERALS) TABS tablet Take 1 tablet by mouth daily.    ? SYNTHROID 50 MCG tablet Take 50 mcg by mouth every morning.    ?  vitamin B-12 (CYANOCOBALAMIN) 100 MCG tablet Take 100 mcg by mouth daily.    ? VITAMIN D-VITAMIN K PO Take by mouth.    ? liothyronine (CYTOMEL) 50 MCG tablet Take 50 mcg by mouth every other day.    ? SYNTHROID 25 MCG tablet Take 1 tablet (25 mcg total) by mouth daily. 30 tablet 1  ? ?No facility-administered medications prior to visit.  ? ?Allergies  ?Allergen Reactions  ? Clindamycin/Lincomycin   ?  C-Dif toxicity  ? Methotrexate Derivatives Anaphylaxis  ?  Fever,lymphadnopathy  ? Augmentin [Amoxicillin-Pot Clavulanate] Nausea And Vomiting  ?  Generic brands only  ? Other   ?  absorbable sutures; Reports they do not absorb and create an abscess.   ?  Pneumococcal Vaccines   ?  Patient states she is allergic to all vaccines due to her lupus  ?  ?Objective:  ? ?Today's Vitals  ? 11/22/21 1400  ?BP: 122/76  ?Pulse: 76  ?Temp: (!) 97 ?F (36.1 ?C)  ?TempSrc: Temporal  ?SpO2: 97%  ?Weight: 203 lb (92.1 kg)  ?Height: '5\' 5"'  (1.651 m)  ? ?Body mass index is 33.78 kg/m?.  ? ?General: Well developed, well nourished. No acute distress. ?Psych: Alert and oriented. Normal mood and affect. ? ?Health Maintenance Due  ?Topic Date Due  ? Hepatitis C Screening  Never done  ? TETANUS/TDAP  Never done  ? COLONOSCOPY (Pts 45-64yr Insurance coverage will need to be confirmed)  Never done  ? ?Lab Results (09/17/2021) ? ?TSH:   40.9 mU/L (H) ?T4, Free (direct): 0.27 ng/dL (L) ?Reverse, T3, Serum: <5.0 ng/dL (L) ?Triiodothyropnine (T3), Free: 1.5 pg/mL (L) ? ?Comprehensive Metabolic Panel ? Sodium: 140 mEq/L ? Potassium: 4.3 mEq/L ? Chloride: 104 mEq/L ? CO2:  24 mmol/L ? Glucose: 86 mg/dL ? BUN:  19 mg/dL ? Creatinine: 0.91 mg/dL ? eGFR:  68 mL/min/1.75m? ?Complete Blood Count: ? WBC:  6.6 K/uL ? RBC:  3.87 M/mm3 ? Hemoglobin: 12.1 gm/dL ? Hematocrit: 34.7 % ? MCV:  90 fL ? MCH:  31.3 pg ? MCHC:  34.9 g/dL ? RDW:  16.5 fL (H) ? Platelets: 262 K/cumm ? ?ANA Comprehensive Plus Panel ? All negative, except Anti SS-A (2.8) ? ?Iron Panel- Normal ?B12, Folate- Normal ? ?EBV IgG, Mycoplasma pneumoniae IgG, CMV IgG, Toxoplasma gondii IgG ?   ?Assessment & Plan:  ? ?1. 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 Iacobucci's TSH was high and her T3/T4 low in January. This likely was the cause of the fatigue she experienced suddenly in Dec. Dr. BeAce Ginsid increase her T3 and T4 doses. ClLyndee Leootes she is already scheduled for follow-up lab testing this week. She will request a copy be sent to me. ? ?2. Atypical ductal hyperplasia of left breast ?Ongoing conversation about appropriate follow-up. I will continue to encourage her to follow through on mammograms. ? ?Return in about 6  months (around 05/25/2022) for Reassessment.  ? ?StHaydee SalterMD ?

## 2022-01-13 ENCOUNTER — Encounter (HOSPITAL_COMMUNITY): Payer: Self-pay

## 2022-06-27 ENCOUNTER — Telehealth: Payer: Self-pay | Admitting: Family Medicine

## 2022-06-27 NOTE — Telephone Encounter (Signed)
Pt want to know if you can refer her to ENT doctor . Pt states she has swelling on side of face and pt stated sinus on left side is stopped up. Pt went to the dentist and that was not anything wrong

## 2022-06-27 NOTE — Telephone Encounter (Signed)
Appointment has been schedule for 06/28/22

## 2022-06-28 ENCOUNTER — Telehealth: Payer: Self-pay | Admitting: Family Medicine

## 2022-06-28 ENCOUNTER — Encounter: Payer: Self-pay | Admitting: Family Medicine

## 2022-06-28 ENCOUNTER — Ambulatory Visit (INDEPENDENT_AMBULATORY_CARE_PROVIDER_SITE_OTHER): Payer: Medicare Other | Admitting: Family Medicine

## 2022-06-28 VITALS — BP 128/80 | HR 79 | Temp 98.9°F

## 2022-06-28 DIAGNOSIS — J0100 Acute maxillary sinusitis, unspecified: Secondary | ICD-10-CM

## 2022-06-28 DIAGNOSIS — J029 Acute pharyngitis, unspecified: Secondary | ICD-10-CM

## 2022-06-28 LAB — POCT RAPID STREP A (OFFICE): Rapid Strep A Screen: NEGATIVE

## 2022-06-28 MED ORDER — AUGMENTIN 500-125 MG PO TABS: 1.0000 | ORAL_TABLET | Freq: Three times a day (TID) | ORAL | 0 refills | Status: DC

## 2022-06-28 MED ORDER — FLUTICASONE PROPIONATE 50 MCG/ACT NA SUSP: 2.0000 | Freq: Every day | NASAL | 6 refills | Status: AC

## 2022-06-28 MED ORDER — AMOXICILLIN 500 MG PO CAPS: 500.0000 mg | ORAL_CAPSULE | Freq: Three times a day (TID) | ORAL | 0 refills | Status: DC

## 2022-06-28 NOTE — Progress Notes (Signed)
Assessment/Plan:   Problem List Items Addressed This Visit   None Visit Diagnoses     Acute non-recurrent maxillary sinusitis    -  Primary   Relevant Medications   fluticasone (FLONASE) 50 MCG/ACT nasal spray   Sore throat       Relevant Orders   POCT rapid strep A (Completed)     Symptoms most likely consistent with left maxillary sinusitis.  Trial amoxicillin and Flonase.  Patient follow-up. Results for orders placed or performed in visit on 06/28/22  POCT rapid strep A  Result Value Ref Range   Rapid Strep A Screen Negative Negative        Subjective:  HPI:  SHAYLYNNE LUNT is a 71 y.o. 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   She  has a past medical history of Bronchitis, Difficult intubation, Eustachian tube dysfunction, GERD (gastroesophageal reflux disease), History of Graves' disease, Hypothyroidism, Lupus (HCC), and PVC's (premature ventricular contractions)..   She presents with chief complaint of Facial Pain (Left sided face pain and pressure and sore throat x Saturday. Patient saw her dentist and didn't see anything on xray.) .  Facial pain.  Patient complains of left maxillary sinus pain, and left upper mouth/jaw pain.  Associated symptoms include nasal blockage, sinus and nasal congestion, left sided sinus pain, and sore throat.Onset of symptoms was 10 days ago, gradually improving since that time. She is drinking plenty of fluids. She has not had recent close exposure to someone with proven streptococcal pharyngitis.  Patient was saw her outside dentist about 1  week ago.  Reports that she had significant maxillary tuberosity pain and swelling the right side.  Patient dental x-ray that was negative.  Patient has been taking OTC motrin, Claritin for symptoms with mild to moderate improvement with out incomplete control.  Patient denies any fevers, chills, shortness of breath, oral ulcers.  She has not tried flonase.  Past Surgical History:  Procedure Laterality Date   KNEE SURGERY Left    LAPAROSCOPIC GASTRIC BANDING WITH HIATAL HERNIA REPAIR     LUMBAR LAMINECTOMY     MANDIBLE SURGERY     MASS EXCISION Left 01/01/2015   Procedure: EXCISION MASS LEFT 1ST WEB SPACE;  Surgeon: Cindee Salt, MD;  Location: [facility];  Service: Orthopedics;  Laterality: Left;   RADIOACTIVE SEED GUIDED EXCISIONAL BREAST BIOPSY Left 11/17/2020   Procedure: LEFT BREAST SEED GUIDED EXCISIONAL BIOPSY X 2;  Surgeon: Emelia Loron, MD;  [facility];  Service: General;  Laterality: Left;   SPINAL FUSION  09/2019   TONSILLECTOMY      Outpatient Medications Prior to Visit  Medication Sig Dispense Refill   CYTOMEL 25 MCG tablet Take 37.5 mcg by mouth every morning. 1.5 daily     ibuprofen (ADVIL) 400 MG tablet Take 400 mg by mouth as needed.     Multiple Vitamin (MULTIVITAMIN WITH MINERALS) TABS tablet Take 1 tablet by mouth daily.     SYNTHROID 50 MCG tablet Take 50 mcg by mouth every morning.     vitamin B-12 (CYANOCOBALAMIN) 100 MCG tablet Take 100 mcg by mouth daily.  VITAMIN D-VITAMIN K PO Take by mouth.     No facility-administered medications prior to visit.    Family History  Problem Relation Age of Onset   Brain cancer Father        Glioblastoma   Cancer Sister        Breast   Arrhythmia Brother        Ablation   Heart disease Maternal Grandfather    Cancer Maternal Grandfather        Colon    Social History   Socioeconomic History   Marital status: Single    Spouse name: Not on file   Number of children: 0    Years of education: Not on file   Highest education level: Professional school degree (e.g., MD, DDS, DVM, JD)  Occupational History   Occupation: Retired- Education officer, community  Tobacco Use   Smoking status: Every Day    Packs/day: 0.50    Years: 48.00    Total pack years: 24.00    Types: Cigarettes   Smokeless tobacco: Never  Vaping Use   Vaping Use: Never used  Substance and Sexual Activity   Alcohol use: Yes    Comment: occ   Drug use: No   Sexual activity: Yes    Birth control/protection: Post-menopausal  Other Topics Concern   Not on file  Social History Narrative   Not on file   Social Determinants of Health   Financial Resource Strain: Low Risk  (07/09/2020)   Overall Financial Resource Strain (CARDIA)    Difficulty of Paying Living Expenses: Not hard at all  Food Insecurity: No Food Insecurity (07/09/2020)   Hunger Vital Sign    Worried About Running Out of Food in the Last Year: Never true    Ran Out of Food in the Last Year: Never true  Transportation Needs: No Transportation Needs (07/09/2020)   PRAPARE - Administrator, Civil Service (Medical): No    Lack of Transportation (Non-Medical): No  Physical Activity: Insufficiently Active (07/09/2020)   Exercise Vital Sign    Days of Exercise per Week: 2 days    Minutes of Exercise per Session: 60 min  Stress: No Stress Concern Present (07/09/2020)   [employer] of Occupational Health - Occupational Stress Questionnaire    Feeling of Stress : Not at all  Social Connections: Moderately Integrated (07/09/2020)   Social Connection and Isolation Panel [NHANES]    Frequency of Communication with Friends and Family: More than three times a week    Frequency of Social Gatherings with Friends and Family: Once a week    Attends Religious Services: More than 4 times per year    Active Member of [employer] or Organizations: Yes    Attends Banker Meetings: 1 to 4 times per year    Marital Status: Never married   Intimate Partner Violence: Not At Risk (07/09/2020)   Humiliation, Afraid, Rape, and Kick questionnaire    Fear of Current or Ex-Partner: No    Emotionally Abused: No    Physically Abused: No    Sexually Abused: No  Objective:  Physical Exam: BP 128/80 (BP Location: Right Arm, Patient Position: Sitting, Cuff Size: Large)   Pulse 79   Temp 98.9 F (37.2 C) (Oral)   LMP  (LMP Unknown)   SpO2 97%    General: No acute distress. Awake and conversant.  Eyes: Normal conjunctiva, anicteric. Round symmetric pupils.  ENT: Hearing grossly intact. No nasal discharge.  No oropharyngeal lesions, there is tenderness over the left upper jaw and inner cheek, left maxillary sinus tenderness, mild Neck: Neck is supple. No masses or thyromegaly.  Respiratory: Respirations are non-labored. No auditory wheezing.  Skin: Warm. No rashes or ulcers.  Psych: Alert and oriented. Cooperative, Appropriate mood and affect, Normal judgment.  CV: No cyanosis or JVD MSK: Normal ambulation. No clubbing  Neuro: Sensation and CN II-XII grossly normal.        Alesia Banda, MD, MS

## 2022-06-28 NOTE — Telephone Encounter (Signed)
Pt is saying they do not make Augmentin anymore. Her pharmacy has requested her to ask for Amoxicillin,   [facility] [place] - [address]  [address], [place] [address]  Phone:  [phone]  Fax:  [phone]

## 2022-07-05 ENCOUNTER — Encounter: Payer: Self-pay | Admitting: Family Medicine

## 2022-07-06 ENCOUNTER — Ambulatory Visit (INDEPENDENT_AMBULATORY_CARE_PROVIDER_SITE_OTHER): Payer: Medicare Other | Admitting: Family Medicine

## 2022-07-06 VITALS — BP 122/84 | HR 84 | Temp 98.7°F

## 2022-07-06 DIAGNOSIS — J0100 Acute maxillary sinusitis, unspecified: Secondary | ICD-10-CM

## 2022-07-06 MED ORDER — AZITHROMYCIN 250 MG PO TABS: ORAL_TABLET | ORAL | 0 refills | Status: AC

## 2022-07-06 NOTE — Patient Instructions (Addendum)
Try azithromycin  Stop amoxicillin

## 2022-07-06 NOTE — Progress Notes (Signed)
Assessment/Plan:   Problem List Items Addressed This Visit   None Visit Diagnoses     Acute non-recurrent maxillary sinusitis    -  Primary   Relevant Medications   azithromycin (ZITHROMAX) 250 MG tablet      Symptoms most likely consistent with left maxillary sinusitis.  Trial amoxicillin and Flonase.  Patient follow-up. No results found for any visits on 07/06/22.  Likely acute sinusitis that is refractory to amoxicillin.  Recommend switching to alternative, patient declines to pursue doxycycline, she is amenable to azithromycin.  Discussed additional supportive care including oral steroids and ipratropium nasal spray, patient declines this as well at this time. Trial azithromycin DC amoxicillin Continue OTC analgesics as needed If no improvement within 72 hours recommend clinical follow-up, CT of sinuses, and/or referral to ENT ED precautions also discussed     Subjective:  HPI:  Angela Scott is a 71 y.o. 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   She  has a past medical history of Bronchitis, Difficult intubation, Eustachian tube dysfunction, GERD (gastroesophageal reflux disease), History of Graves' disease, Hypothyroidism, Lupus ([place]), and PVC's (premature ventricular contractions)..   She presents with chief complaint of Follow-up (Fatigued, sore throat, congestion, jaw pain has gotten better. Low fever 99.4) .  Facial pain.   Update 07/06/22   Patient reports minimal improvement in congestion and maxillary pain.  Patient has been on Flonase and amoxicillin 500 mg 3  times daily for 6 days.  Patient reports ongoing sinus congestion and sore throat and subjective fevers but no temperature greater than 100.  Patient denies any lesions, chest pain, shortness of breath.   06/28/2022 Patient complains of left maxillary sinus pain, and left upper mouth/jaw pain.  Associated symptoms include nasal blockage, sinus and nasal congestion, left sided sinus pain, and sore throat.Onset of symptoms was 10 days ago, gradually improving since that time. She is drinking plenty of fluids. She has not had recent close exposure to someone with proven streptococcal pharyngitis.  Patient was saw her outside dentist about 1 week ago.  Reports that she had significant maxillary tuberosity pain and swelling the right side.  Patient dental x-ray that was negative.  Patient has been taking OTC motrin, Claritin for symptoms with mild to moderate improvement with out incomplete control.  Patient denies any fevers, chills, shortness of breath, oral ulcers.  She has not tried flonase.  Past Surgical History:  Procedure Laterality Date   KNEE SURGERY Left    LAPAROSCOPIC GASTRIC BANDING WITH HIATAL HERNIA REPAIR     LUMBAR LAMINECTOMY     MANDIBLE SURGERY     MASS EXCISION Left 01/01/2015   Procedure: EXCISION MASS LEFT 1ST WEB SPACE;  Surgeon: Daryll Brod, MD;  Location: [facility];  Service: Orthopedics;  Laterality: Left;   RADIOACTIVE SEED GUIDED EXCISIONAL BREAST BIOPSY Left 11/17/2020   Procedure: LEFT BREAST SEED GUIDED EXCISIONAL BIOPSY X 2;  Surgeon: Rolm Bookbinder, MD;  Location: [facility];  Service: General;  Laterality: Left;   SPINAL FUSION  09/2019   TONSILLECTOMY      Outpatient Medications Prior to Visit  Medication Sig  Dispense Refill   CYTOMEL 25 MCG tablet Take 37.5 mcg by mouth every morning. 1.5 daily     fluticasone (FLONASE) 50 MCG/ACT nasal spray Place 2 sprays into both nostrils daily. 16 g 6   ibuprofen (ADVIL) 400 MG tablet Take  400 mg by mouth as needed.     Multiple Vitamin (MULTIVITAMIN WITH MINERALS) TABS tablet Take 1 tablet by mouth daily.     SYNTHROID 50 MCG tablet Take 50 mcg by mouth every morning.     vitamin B-12 (CYANOCOBALAMIN) 100 MCG tablet Take 100 mcg by mouth daily.     VITAMIN D-VITAMIN K PO Take by mouth.     amoxicillin (AMOXIL) 500 MG capsule Take 1 capsule (500 mg total) by mouth 3 (three) times daily for 10 days. 30 capsule 0   No facility-administered medications prior to visit.    Family History  Problem Relation Age of Onset   Brain cancer Father        Glioblastoma   Cancer Sister        Breast   Arrhythmia Brother        Ablation   Heart disease Maternal Grandfather    Cancer Maternal Grandfather        Colon    Social History   Socioeconomic History   Marital status: Single    Spouse name: Not on file   Number of children: 0   Years of education: Not on file   Highest education level: Professional school degree (e.g., MD, DDS, DVM, JD)  Occupational History   Occupation: Retired- Education officer, community  Tobacco Use   Smoking status: Every Day    Packs/day: 0.50    Years: 48.00    Total pack years: 24.00    Types: Cigarettes   Smokeless tobacco: Never  Vaping Use   Vaping Use: Never used  Substance and Sexual Activity   Alcohol use: Yes    Comment: occ   Drug use: No   Sexual activity: Yes    Birth control/protection: Post-menopausal  Other Topics Concern   Not on file  Social History Narrative   Not on file   Social Determinants of Health   Financial Resource Strain: Low Risk  (07/09/2020)   Overall Financial Resource Strain (CARDIA)    Difficulty of Paying Living Expenses: Not hard at all  Food Insecurity: No Food Insecurity (07/09/2020)   Hunger Vital Sign    Worried About Running Out of Food in the Last Year: Never true    Ran Out of Food in the Last Year: Never true  Transportation Needs: No Transportation Needs (07/09/2020)   PRAPARE - Doctor, general practice (Medical): No    Lack of Transportation (Non-Medical): No  Physical Activity: Insufficiently Active (07/09/2020)   Exercise Vital Sign    Days of Exercise per Week: 2 days    Minutes of Exercise per Session: 60 min  Stress: No Stress Concern Present (07/09/2020)   [employer] of Occupational Health - Occupational Stress Questionnaire    Feeling of Stress : Not at all  Social Connections: Moderately Integrated (07/09/2020)   Social Connection and Isolation Panel [NHANES]    Frequency of Communication with Friends and Family: More than three times a week    Frequency of Social Gatherings with Friends and Family: Once a week    Attends Religious Services: More than 4 times per year    Active Member of [employer] or Organizations: Yes    Attends Banker  Meetings: 1 to 4 times per year    Marital Status: Never married  Intimate Partner Violence: Not At Risk (07/09/2020)   Humiliation, Afraid, Scott, and Kick questionnaire    Fear of Current or Ex-Partner: No    Emotionally Abused: No    Physically Abused: No    Sexually Abused: No                                                                                                 Objective:  Physical Exam: BP 122/84 (BP Location: Right Arm, Patient Position: Sitting, Cuff Size: Large)   Pulse 84   Temp 98.7 F (37.1 C) (Oral)   LMP  (LMP Unknown)   SpO2 97%    General: No acute distress. Awake and conversant.  Eyes: Normal conjunctiva, anicteric. Round symmetric pupils.  ENT: Hearing grossly intact. No nasal discharge.  No oropharyngeal lesions, there is tenderness over the left upper jaw and inner cheek, also tenderness to left palate, mild left maxillary sinus tenderness Neck: Neck is supple. No masses or thyromegaly.  Respiratory: Respirations are non-labored. No auditory wheezing.  Skin: Warm. No rashes or ulcers.  Psych: Alert and oriented. Cooperative, Appropriate mood and affect, Normal judgment.   CV: No cyanosis or JVD MSK: Normal ambulation. No clubbing  Neuro: Sensation and CN II-XII grossly normal.        Garner Nash, MD, MS

## 2022-08-02 NOTE — Progress Notes (Unsigned)
Cardiology Office Note  Date: 08/03/2022   ID: Hartley, Wyke August 03, 1951, MRN [id number]  PCP:  Loyola Mast, MD  Cardiologist:  Nona Dell, MD Electrophysiologist:  None   Chief Complaint  Patient presents with   Cardiac follow-up    History of Present Illness: Angela Scott is a 71 y.o. female last seen in February 2022.  She is here for a routine visit.  Reports no significant sense of progressive palpitations, no unexplained syncope or chest pain. I personally reviewed her ECG today which shows sinus rhythm with leftward axis, low voltage.  She states that she has a general lack of energy, trying to exercise and lose weight, but this has been difficult.  Also had a left knee injury in the meanwhile which slowed her down some.  She is following with a naturopathic provider and has been on other peptide supplements.  Feels like her thyroid status has been in good order as well.  I reviewed her lab work as noted below.  Blood pressure rechecked by me in the right arm today 132/78.  Past Medical History:  Diagnosis Date   Bronchitis    Difficult intubation    States x 2 with Lap Band 2007 at [facility] and at [facility] 06/2019; reports severe sore throat for both surgeries and coughed up blood post surgery x 1 week   Eustachian tube dysfunction    GERD (gastroesophageal reflux disease)    History of Graves' disease    Hypothyroidism    Lupus ([facility])    PVC's (premature ventricular contractions)    Likely outflow tract origin, LVEF normal    Past Surgical History:  Procedure Laterality Date   KNEE SURGERY Left    LAPAROSCOPIC GASTRIC BANDING WITH HIATAL HERNIA REPAIR     LUMBAR LAMINECTOMY     MANDIBLE SURGERY     MASS EXCISION Left 01/01/2015   Procedure: EXCISION MASS LEFT 1ST WEB SPACE;  Surgeon: Cindee Salt, MD;  Location: [facility];  Service: Orthopedics;  Laterality: Left;   RADIOACTIVE SEED GUIDED EXCISIONAL BREAST BIOPSY  Left 11/17/2020   Procedure: LEFT BREAST SEED GUIDED EXCISIONAL BIOPSY X 2;  Surgeon: Emelia Loron, MD;  Location: [facility];  Service: General;  Laterality: Left;   SPINAL FUSION  09/2019   TONSILLECTOMY      Current Outpatient Medications  Medication Sig Dispense Refill   CYTOMEL 25 MCG tablet Take 37.5 mcg by mouth every morning. 1.5 daily     fluticasone (FLONASE) 50 MCG/ACT nasal spray Place 2 sprays into both nostrils daily. 16 g 6   ibuprofen (ADVIL) 400 MG tablet Take 400 mg by mouth as needed.     Multiple Vitamin (MULTIVITAMIN WITH MINERALS) TABS tablet Take 1 tablet by mouth daily.     SYNTHROID 88 MCG tablet Take 88 mcg by mouth every morning.     vitamin B-12 (CYANOCOBALAMIN) 100 MCG tablet Take 100 mcg by mouth daily.     VITAMIN D-VITAMIN K PO Take by mouth.     No current facility-administered medications for this visit.   Allergies:  Clindamycin/lincomycin, Methotrexate derivatives, Augmentin [amoxicillin-pot clavulanate], Other, and Pneumococcal vaccines   ROS: No orthopnea or PND.  Feels well rested when she gets up in the morning.  Physical Exam: VS:  BP 132/78   Pulse 79   Ht 5\' 5"  (1.651 m)   LMP  (LMP Unknown)   SpO2 98%   BMI 33.78 kg/m , BMI Body  mass index is 33.78 kg/m.  Wt Readings from Last 3 Encounters:  11/22/21 203 lb (92.1 kg)  06/21/21 201 lb 14.4 oz (91.6 kg)  11/17/20 226 lb 3.1 oz (102.6 kg)    General: Patient appears comfortable at rest. HEENT: Conjunctiva and lids normal. Neck: Supple, no elevated JVP or carotid bruits. Lungs: Clear to auscultation, nonlabored breathing at rest. Cardiac: Regular rate and rhythm, no S3 or significant systolic murmur, no pericardial rub. Extremities: No pitting edema.  ECG:  An ECG dated 01/24/2020 was personally reviewed today and demonstrated:  Sinus rhythm with small R' in lead V1 and V2, nonspecific T wave changes, single PVC likely from outflow tract.  Recent Labwork:     Component Value Date/Time   CHOL 161 06/19/2020 1407   TRIG 112 06/19/2020 1407   HDL 57 06/19/2020 1407   CHOLHDL 2.8 06/19/2020 1407   LDLCALC 83 06/19/2020 1407  January 2023: Hemoglobin 12.1, platelets 262, BUN 19, creatinine 0.91, potassium 4.3, AST 17, ALT 10, TSH 40.9, free T40.27, reverse T3 less than 5.0  Other Studies Reviewed Today:  Echocardiogram 04/26/2019: 1. The left ventricle has normal systolic function with an ejection  fraction of 60-65%. The cavity size was normal. Left ventricular diastolic  parameters were normal.   2. The right ventricle has normal systolic function. The cavity was  normal. There is no increase in right ventricular wall thickness.   3. The aorta is abnormal unless otherwise noted.   4. There is mild dilatation of the ascending aorta measuring 38 mm.   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  She does not report any progressive palpitations, no chest pain or unexplained syncope.  Has not required any AV nodal blockers to this point and we continue with observation for now.  Medication Adjustments/Labs and Tests Ordered: Current medicines are reviewed at length with the patient today.  Concerns regarding medicines are outlined above.   Tests Ordered: Orders Placed This Encounter  Procedures   EKG 12-Lead    Medication Changes: No orders of the defined types were placed in this encounter.   Disposition:  Follow up  1 year, sooner if needed.  Signed, Jonelle Sidle, MD, [facility] 08/03/2022 2:32 PM    [facility] [address] [place], [place] [address] Phone: [phone]; Fax: [phone]

## 2022-08-03 ENCOUNTER — Encounter: Payer: Self-pay | Admitting: Cardiology

## 2022-08-03 ENCOUNTER — Ambulatory Visit: Payer: Medicare Other | Attending: Cardiology | Admitting: Cardiology

## 2022-08-03 VITALS — BP 132/78 | HR 79 | Ht 65.0 in

## 2022-08-03 DIAGNOSIS — I493 Ventricular premature depolarization: Secondary | ICD-10-CM

## 2022-08-03 NOTE — Patient Instructions (Signed)
Medication Instructions:  Your physician recommends that you continue on your current medications as directed. Please refer to the Current Medication list given to you today.   Labwork: None today  Testing/Procedures: None today  Follow-Up: 1 year  Any Other Special Instructions Will Be Listed Below (If Applicable).  If you need a refill on your cardiac medications before your next appointment, please call your pharmacy.  

## 2022-10-06 ENCOUNTER — Other Ambulatory Visit (HOSPITAL_BASED_OUTPATIENT_CLINIC_OR_DEPARTMENT_OTHER): Payer: Self-pay | Admitting: Neurosurgery

## 2022-10-06 DIAGNOSIS — R200 Anesthesia of skin: Secondary | ICD-10-CM

## 2022-10-13 ENCOUNTER — Encounter: Payer: Self-pay | Admitting: Family Medicine

## 2022-10-15 ENCOUNTER — Ambulatory Visit (HOSPITAL_BASED_OUTPATIENT_CLINIC_OR_DEPARTMENT_OTHER)
Admission: RE | Admit: 2022-10-15 | Discharge: 2022-10-15 | Disposition: A | Discharge status: Home / Self Care | Payer: Medicare Other | Source: Ambulatory Visit | Attending: Neurosurgery | Admitting: Neurosurgery

## 2022-10-15 DIAGNOSIS — R200 Anesthesia of skin: Secondary | ICD-10-CM

## 2022-10-15 MED ORDER — GADOBUTROL 1 MMOL/ML IV SOLN
9.0000 mL | Freq: Once | INTRAVENOUS | Status: AC | PRN
  Administered 2022-10-15: 9 mL via INTRAVENOUS

## 2022-10-24 NOTE — Therapy (Signed)
OUTPATIENT PHYSICAL THERAPY THORACOLUMBAR EVALUATION   Patient Name: Angela Scott MRN: [id number] DOB:1950-11-24, 72 y.o., female Today's Date: 10/26/2022  END OF SESSION:  PT End of Session - 10/26/22 0800     Visit Number 1    Date for PT Re-Evaluation 12/07/22    Authorization Type Medicare & [employer]    PT Start Time 0805    PT Stop Time 0850    PT Time Calculation (min) 45 min    Activity Tolerance Patient tolerated treatment well    Behavior During Therapy [facility] for tasks assessed/performed             Past Medical History:  Diagnosis Date   Bronchitis    Difficult intubation    States x 2 with Lap Band 2007 at [facility] and at [facility] 06/2019; reports severe sore throat for both surgeries and coughed up blood post surgery x 1 week   Eustachian tube dysfunction    GERD (gastroesophageal reflux disease)    History of Graves' disease    Hypothyroidism    Lupus ([place])    PVC's (premature ventricular contractions)    Likely outflow tract origin, LVEF normal   Past Surgical History:  Procedure Laterality Date   KNEE SURGERY Left    LAPAROSCOPIC GASTRIC BANDING WITH HIATAL HERNIA REPAIR     LUMBAR LAMINECTOMY     MANDIBLE SURGERY     MASS EXCISION Left 01/01/2015   Procedure: EXCISION MASS LEFT 1ST WEB SPACE;  Surgeon: Daryll Brod, MD;  Location: [place];  Service: Orthopedics;  Laterality: Left;   RADIOACTIVE SEED GUIDED EXCISIONAL BREAST BIOPSY Left 11/17/2020   Procedure: LEFT BREAST SEED GUIDED EXCISIONAL BIOPSY X 2;  Surgeon: Rolm Bookbinder, MD;  Location: [place];  Service: General;  Laterality: Left;   SPINAL FUSION  09/2019   TONSILLECTOMY     Patient Active Problem List   Diagnosis Date Noted   Class 1 obesity due to excess calories with body mass index (BMI) of 33.0 to 33.9 in adult 11/22/2021   Tobacco use disorder, mild, abuse 06/21/2021   Atypical ductal hyperplasia of left breast  06/21/2021   Lumbar foraminal stenosis 11/20/2019   Spondylolisthesis of lumbar region 09/26/2019   Spinal stenosis of lumbar region 09/11/2019   Lumbar radiculopathy 04/09/2019   H/O Graves' disease 07/16/2013   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 11/20/2012   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 02/05/2010   GERD 02/05/2010   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    PCP: Haydee Salter, MD  REFERRING PROVIDER: Vallarie Mare, MD  REFERRING DIAG: R26.9 (ICD-10-CM) - Gait disturbance  RATIONALE FOR EVALUATION AND TREATMENT: Rehabilitation  THERAPY DIAG:  Unsteadiness on feet  Muscle weakness (generalized)  Other abnormalities of gait and mobility  ONSET DATE: 2020-now   NEXT MD VISIT: next week with Dr.Thomas   SUBJECTIVE:  SUBJECTIVE STATEMENT: Spinal Fusion/ Decompression in late 2020 and did PT in early 2021, had Thyroid issues afterwards and had to get that regulated, walk has "always felt weird" but continued running, now can hardly walk steps now especially in the PM, bad days she can't carry something up the stairs, walking has become difficult for her, uses a crutch to walk through parking lots and grocery stores because she can't push off with L leg, Thyroid levels are regulated now due to medication. Can't raise up on her L foot. Denies any back pain throughout all of this.    PERTINENT HISTORY:  Obesity, Discoid Lupus, 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   PAIN:  Are you having pain? No  PRECAUTIONS: None  WEIGHT BEARING RESTRICTIONS: No  FALLS:  Has patient fallen in last 6 months? No  LIVING ENVIRONMENT: Lives with:  her two dogs Lives in:  House/apartment Stairs: Yes: Internal: 14 steps; can reach both and External: 2 steps; can reach both Has following equipment at home: Crutches  OCCUPATION: Retired Medical illustrator ACTIVITIES: Running, Tennis   PLOF: Independent  PATIENT GOALS: get better at walking and be strong when doing her activities to get back to PLOF   OBJECTIVE:   DIAGNOSTIC FINDINGS:  10/15/22 - MRI Lumbar Spine:  Minimal grade 1 anterolisthesis of L3 on L4 and L4 on L5.Minimal retrolisthesis of L5 on S1. At L2-3 there is a broad-based disc bulge. Moderate bilateral facet arthropathy with ligamentum flavum infolding. Moderate-severe spinal stenosis. At L3-4 and L4-5 there is interbody fusion and posterior decompression. No foraminal or central canal stenosis. At L5-S1 there is a broad-based disc bulge with a central annular fissure. Mild bilateral facet arthropathy. Mild bilateral foraminal stenosis. No acute osseous injury of the lumbar spine  10/22/22 - NCV/EMG: Electrodiagnostic evidence of a chronic lumbosacral radiculopathy, affecting the left L3, L4 and S1 nerve roots. No active denervation noted in the muscles supplied by the respective nerve roots. Recruitment is appropriate in all muscles supplied by the respective nerve roots.  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  Electrodiagnostic evidence of a left tibial motor demyelinating peripheral neuropathy.  Absent left sural sensory nerve conduction study. This can be a normal finding in age > 71 yrs old.  No electrodiagnostic evidence of a myopathy in the left lower extremity.  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  No electrodiagnostic evidence of a right tibial motor peripheral neuropathy.  Absent right sural sensory nerve conduction study. This can be a normal finding in age > 41 yrs old.  PATIENT  SURVEYS:  ABC Scale: 1530 / 1600 = 95.6 %  SCREENING FOR RED FLAGS: Bowel or bladder incontinence: Yes: bladder-has some leaks Pelvic Floor exercises  Spinal tumors: No Cauda equina syndrome: No Compression fracture: No Abdominal aneurysm: No  COGNITION: Overall cognitive status: Within functional limits for tasks assessed     SENSATION Chronic numbness in her L foot since surgery, intermittent numbness on R foot  MUSCLE LENGTH: Hamstrings: mild Thomas test: mild tight Quads: L>R mod tight Hip Extensors:mild ITB: WNL  POSTURE:  No Significant postural limitations  LUMBAR ROM:   AROM eval  Flexion WNL  Extension Iimited due to fusion  Right lateral flexion Knee   Left lateral flexion Mid thigh  Right rotation WNL  Left rotation WNL    (  Blank rows = not tested)  LOWER EXTREMITY ROM:  Grossly WNL   LOWER EXTREMITY MMT:     Active  Right eval Left eval  Hip flexion 4 4  Hip extension 3+ 3+  Hip abduction 4 4  Hip adduction 4+ 4  Hip internal rotation    Hip external rotation    Knee flexion 5 5  Knee extension 5 5  Ankle dorsiflexion 5 5  Ankle plantarflexion    Ankle inversion    Ankle eversion     (Blank rows = not tested)  LUMBAR SPECIAL TESTS:  NT   FUNCTIONAL TESTS:  5 times sit to stand: 11.16 seconds  Timed up and go (TUG): 11.31 seconds 10 meter walk test: 12.28 seconds Gait speed: 2.67 ft/sec (0.81 m/s)  GAIT: Distance walked: 20 m Assistive device utilized: None Level of assistance: Complete Independence Gait pattern: decreased stance time- Left, decreased stride length, and wide BOS Comments: pt states weariness when walking, states that she just feels unstable most of the time   TODAY'S TREATMENT:                                                                                                                              DATE:   10/26/22 Initial Eval   PATIENT EDUCATION:  Education details: Initial POC, Eval findings Person  educated: Patient Education method: Explanation Education comprehension: verbalized understanding and needs further education  HOME EXERCISE PROGRAM: TBD   ASSESSMENT:  CLINICAL IMPRESSION: Patient is a 72 y.o. female who was seen today for physical therapy evaluation and treatment for unsteadiness on her feet. Pt states how ever since her back surgery in late 2020, she has felt herself grow weak and unsteady on her feet, with the addition of other health conditions. Pt states she wishes to be able to go back to running but even now taking numerous steps is hard for her, especially pushing off from her L leg. Pt uses a crutch to walk in parking lots and inside grocery stores as she feels unstable and slow without it. Testing demonstrated generalized LE muscle weakness L>R which will be addressed in future visits. Overall ROM is WNL although there is noted limitation on R and L lumbar side bending. Muscle length testing proved to show at least some mild tightness with the most notable being pt's bilateral quadricept muscles. Pt's gait assessment demonstrated decreased step length with a wide BOS highlighting the pt's unsteadiness while walking for a certain distance. Pt will benefit from skilled PT interventions to address her strength and gait deficits to increase her confidence when performing these tasks and to return her to her PLOF.   OBJECTIVE IMPAIRMENTS: Abnormal gait, decreased activity tolerance, decreased balance, decreased endurance, decreased knowledge of condition, decreased knowledge of use of DME, difficulty walking, decreased ROM, decreased strength, increased muscle spasms, impaired flexibility, and impaired sensation.   ACTIVITY LIMITATIONS: carrying, lifting, bending, standing, squatting, stairs, and locomotion level  PARTICIPATION LIMITATIONS: cleaning, laundry, interpersonal relationship, shopping, community activity, and yard work  PERSONAL FACTORS: Age, Fitness, Past/current  experiences, Time since onset of injury/illness/exacerbation, and 3+ comorbidities: Obesity, Discoid Lupus, L3-4 & L4-5 PLIF 09/26/19, Lumbar Foraminal Stenosis, Spondylolisthesis of lumbar region, spinal stenosis lumbar, lumbar radiculopathy, GERD, Hypothyroidism, H/O Graves' disease  are also affecting patient's functional outcome.   REHAB POTENTIAL: Good  CLINICAL DECISION MAKING: Stable/uncomplicated  EVALUATION COMPLEXITY: Low   GOALS: Goals reviewed with patient? Yes  SHORT TERM GOALS: Target date: 11/16/2022  Pt will be independent with original HEP.  Baseline: TBD Goal status: INITIAL  2.  Pt will verbalize an increase in confidence when walking by 25-50% to increase her QOL and increase her involvement in her community. Baseline:  Goal status: INITIAL  LONG TERM GOALS: Target date: 12/07/2022  Patient will be independent with ongoing/advanced HEP for self-management at home. Baseline:  Goal status: INITIAL  2.  Pt will report a score of 1600/1600 on the ABC Scale to demonstrate an increase in function and improved balance confidence. Baseline: 1530 / 1600 = 95.6 % Goal status: INITIAL  3.  Pt will increase gross LE strength to a 4+/5 to address walking instability and increase confidence when performing daily activities. Baseline: see MMT chart Goal status: INITIAL  4.  Pt will increase lumbar ROM to [facility] to address her inability to reach down and perform activities on the floor.  Baseline:  Goal status: INITIAL  5.  Pt will verbalize an increase in confidence when walking by 50-75% to increase her QOL and increase her involvement in her community. Baseline:  Goal status: INITIAL  6.  Pt will be able to run for 10 minutes with minimal discomfort or pain.  Baseline:  Goal status: INITIAL  7.  Pt will demonstrate a 33mwalk test speed of >/=1.112m to safely ambulate her community.  Baseline: 0.815mGoal status: INITIAL   PLAN:  PT FREQUENCY: 2x/week  PT  DURATION: 6 weeks  PLANNED INTERVENTIONS: Therapeutic exercises, Therapeutic activity, Neuromuscular re-education, Balance training, Gait training, Patient/Family education, Self Care, Joint mobilization, Stair training, DME instructions, Dry Needling, Electrical stimulation, Spinal manipulation, Spinal mobilization, Cryotherapy, Moist heat, Taping, Ultrasound, Manual therapy, and Re-evaluation.  PLAN FOR NEXT SESSION: Create an HEP, introduce proximal LE strengthening, balance and gait training, MT +/- DN if appropriate.    Izabella Marcantel Romero-Perozo, Student-PT 10/26/2022, 9:04 AM

## 2022-10-26 ENCOUNTER — Encounter: Payer: Self-pay | Admitting: Physical Therapy

## 2022-10-26 ENCOUNTER — Other Ambulatory Visit: Payer: Self-pay

## 2022-10-26 ENCOUNTER — Ambulatory Visit: Payer: Medicare Other | Attending: Neurosurgery | Admitting: Physical Therapy

## 2022-10-26 DIAGNOSIS — R2681 Unsteadiness on feet: Secondary | ICD-10-CM | POA: Diagnosis present

## 2022-10-26 DIAGNOSIS — R2689 Other abnormalities of gait and mobility: Secondary | ICD-10-CM | POA: Diagnosis present

## 2022-10-26 DIAGNOSIS — M6281 Muscle weakness (generalized): Secondary | ICD-10-CM | POA: Diagnosis present

## 2022-10-31 NOTE — Therapy (Signed)
OUTPATIENT PHYSICAL THERAPY TREATMENT   Patient Name: Angela Scott MRN: [id number] DOB:04-07-1951, 72 y.o., female Today's Date: 11/01/2022  END OF SESSION:  PT End of Session - 11/01/22 0754     Visit Number 2    Date for PT Re-Evaluation 12/07/22    Authorization Type Medicare & [employer]    PT Start Time 0800    PT Stop Time [phone]    PT Time Calculation (min) 46 min    Activity Tolerance Patient tolerated treatment well    Behavior During Therapy [facility] for tasks assessed/performed              Past Medical History:  Diagnosis Date   Bronchitis    Difficult intubation    States x 2 with Lap Band 2007 at [facility] and at [facility] 06/2019; reports severe sore throat for both surgeries and coughed up blood post surgery x 1 week   Eustachian tube dysfunction    GERD (gastroesophageal reflux disease)    History of Graves' disease    Hypothyroidism    Lupus ([place])    PVC's (premature ventricular contractions)    Likely outflow tract origin, LVEF normal   Past Surgical History:  Procedure Laterality Date   KNEE SURGERY Left    LAPAROSCOPIC GASTRIC BANDING WITH HIATAL HERNIA REPAIR     LUMBAR LAMINECTOMY     MANDIBLE SURGERY     MASS EXCISION Left 01/01/2015   Procedure: EXCISION MASS LEFT 1ST WEB SPACE;  Surgeon: Daryll Brod, MD;  Location: [place];  Service: Orthopedics;  Laterality: Left;   RADIOACTIVE SEED GUIDED EXCISIONAL BREAST BIOPSY Left 11/17/2020   Procedure: LEFT BREAST SEED GUIDED EXCISIONAL BIOPSY X 2;  Surgeon: Rolm Bookbinder, MD;  Location: [place];  Service: General;  Laterality: Left;   SPINAL FUSION  09/2019   TONSILLECTOMY     Patient Active Problem List   Diagnosis Date Noted   Class 1 obesity due to excess calories with body mass index (BMI) of 33.0 to 33.9 in adult 11/22/2021   Tobacco use disorder, mild, abuse 06/21/2021   Atypical ductal hyperplasia of left breast 06/21/2021   Lumbar  foraminal stenosis 11/20/2019   Spondylolisthesis of lumbar region 09/26/2019   Spinal stenosis of lumbar region 09/11/2019   Lumbar radiculopathy 04/09/2019   H/O Graves' disease 07/16/2013   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 11/20/2012   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 02/05/2010   GERD 02/05/2010   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    PCP: Haydee Salter, MD  REFERRING PROVIDER: Vallarie Mare, MD  REFERRING DIAG: R26.9 (ICD-10-CM) - Gait disturbance  RATIONALE FOR EVALUATION AND TREATMENT: Rehabilitation  THERAPY DIAG:  Unsteadiness on feet  Muscle weakness (generalized)  Other abnormalities of gait and mobility  ONSET DATE: 2020-now   NEXT MD VISIT: next week with Dr.Thomas   SUBJECTIVE:  SUBJECTIVE STATEMENT:  Pt says she feels fine after last time and has overall been low on energy, has an appointment with MD today to perform Ozone Therapy   PERTINENT HISTORY:  Obesity, Discoid Lupus, 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   PAIN:  Are you having pain? No  PRECAUTIONS: None  WEIGHT BEARING RESTRICTIONS: No  FALLS:  Has patient fallen in last 6 months? No  LIVING ENVIRONMENT: Lives with:  her two dogs Lives in: House/apartment Stairs: Yes: Internal: 14 steps; can reach both and External: 2 steps; can reach both Has following equipment at home: Crutches  OCCUPATION: Retired Medical illustrator ACTIVITIES: Running, Tennis   PLOF: Independent  PATIENT GOALS: get better at walking and be strong when doing her activities to get back to PLOF   OBJECTIVE:   DIAGNOSTIC FINDINGS:  10/15/22 - MRI Lumbar Spine:  Minimal grade 1  anterolisthesis of L3 on L4 and L4 on L5.Minimal retrolisthesis of L5 on S1. At L2-3 there is a broad-based disc bulge. Moderate bilateral facet arthropathy with ligamentum flavum infolding. Moderate-severe spinal stenosis. At L3-4 and L4-5 there is interbody fusion and posterior decompression. No foraminal or central canal stenosis. At L5-S1 there is a broad-based disc bulge with a central annular fissure. Mild bilateral facet arthropathy. Mild bilateral foraminal stenosis. No acute osseous injury of the lumbar spine  10/22/22 - NCV/EMG: Electrodiagnostic evidence of a chronic lumbosacral radiculopathy, affecting the left L3, L4 and S1 nerve roots. No active denervation noted in the muscles supplied by the respective nerve roots. Recruitment is appropriate in all muscles supplied by the respective nerve roots.  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  Electrodiagnostic evidence of a left tibial motor demyelinating peripheral neuropathy.  Absent left sural sensory nerve conduction study. This can be a normal finding in age > 39 yrs old.  No electrodiagnostic evidence of a myopathy in the left lower extremity.  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  No electrodiagnostic evidence of a right tibial motor peripheral neuropathy.  Absent right sural sensory nerve conduction study. This can be a normal finding in age > 45 yrs old.  PATIENT SURVEYS:  ABC Scale: 1530 / 1600 = 95.6 %  SCREENING FOR RED FLAGS: Bowel or bladder incontinence: Yes: bladder-has some leaks Pelvic Floor exercises  Spinal tumors: No Cauda equina syndrome: No Compression fracture: No Abdominal aneurysm: No  COGNITION: Overall cognitive status: Within functional limits for tasks assessed     SENSATION Chronic numbness in her L foot since surgery, intermittent numbness on R  foot  MUSCLE LENGTH: Hamstrings: mild Thomas test: mild tight Quads: L>R mod tight Hip Extensors:mild ITB: WNL  POSTURE:  No Significant postural limitations  LUMBAR ROM:   AROM eval  Flexion WNL  Extension Iimited due to fusion  Right lateral flexion Knee   Left lateral flexion Mid thigh  Right rotation WNL  Left rotation WNL    (Blank rows = not tested)  LOWER EXTREMITY ROM:  Grossly WNL   LOWER EXTREMITY MMT:     Active  Right eval Left eval  Hip flexion 4 4  Hip extension 3+ 3+  Hip abduction 4 4  Hip adduction 4+ 4  Hip internal rotation    Hip external rotation    Knee flexion 5 5  Knee extension 5 5  Ankle dorsiflexion 5 5  Ankle plantarflexion    Ankle inversion    Ankle eversion     (Blank rows = not tested)  LUMBAR SPECIAL TESTS:  NT   FUNCTIONAL TESTS:  5 times sit to stand: 11.16 seconds  Timed up and go (TUG): 11.31 seconds 10 meter walk test: 12.28 seconds Gait speed: 2.67 ft/sec (0.81 m/s)  GAIT: Distance walked: 20 m Assistive device utilized: None Level of assistance: Complete Independence Gait pattern: decreased stance time- Left, decreased stride length, and wide BOS Comments: pt states weariness when walking, states that she just feels unstable most of the time   TODAY'S TREATMENT:                                                                                                                              DATE:   10/31/22 THERAPEUTIC EXERCISE: to improve flexibility, strength and mobility.  Verbal and tactile cues throughout for technique. NuStep L5 x6 min  HS Stx w/ strap 2x30"  Quad Stx w/ strap 2x30" LTR x10 Supine March w/ TrA 2x10  Peanut Glute Bridge x10  Supine Glute Bridges x10  Seated hip flexion RTB w/ TrA 2x10 Seated Abduction RTB w/ TrA 2x10  Standing Hip Flx, Ext, Abd #1 at ankle 2x10  Step Ups 6" Fwd/Bwd, Lateral x10 each    10/26/22 Initial Eval    PATIENT EDUCATION:  Education details: Ongoing POC,  Initial HEP Person educated: Patient Education method: Explanation, Demonstration, Tactile cues, Verbal cues, and Handouts Education comprehension: verbalized understanding, returned demonstration, verbal cues required, and needs further education  HOME EXERCISE PROGRAM: Access Code: [email or web address] URL: [email or web address] Date: 11/01/2022 Prepared by: Verdene Lennert Romero-Perozo  Exercises - Supine Hamstring Stretch with Strap  - 1 x daily - 7 x weekly - 3 sets - 30 seconds hold - Prone Quadriceps Stretch with Strap  - 1 x daily - 7 x weekly - 2 sets - 30 seconds hold - Supine Lower Trunk Rotation  - 1 x daily - 7 x weekly - 2 sets - 10 reps - Supine March  - 1 x daily - 7 x weekly - 2 sets - 10 reps - Supine Bridge  - 1 x daily - 7 x weekly - 2 sets - 10 reps - Seated Hip Flexion  - 1 x daily - 7 x weekly - 2 sets - 10 reps   ASSESSMENT:  CLINICAL IMPRESSION:  Pt came into today's session with no pain or discomfort. Pt was introduced to gentle core and proximal hip strengthening to work towards increasing tolerance to exercise and decrease overall fatigue she seems to consistently exhibit. Pt will be going to the MD to get the Ozone therapy to try and see if this helps with her fatigue as well. Gentle stretching of the back and legs was conducted to stretch out the areas she exhibits tightness when walking. With the combination of the proximal hip strengthening and gentle stretching, hopefully  this facilitates foot clearance and increases step stride and length when ambulating.Pt will continue benefit from skilled PT interventions to address her strength and gait deficits to increase her confidence when performing these tasks and to return her to her PLOF.   OBJECTIVE IMPAIRMENTS: Abnormal gait, decreased activity tolerance, decreased balance, decreased endurance, decreased knowledge of condition, decreased knowledge of use of DME, difficulty walking, decreased ROM, decreased  strength, increased muscle spasms, impaired flexibility, and impaired sensation.   ACTIVITY LIMITATIONS: carrying, lifting, bending, standing, squatting, stairs, and locomotion level  PARTICIPATION LIMITATIONS: cleaning, laundry, interpersonal relationship, shopping, community activity, and yard work  PERSONAL FACTORS: Age, Fitness, Past/current experiences, Time since onset of injury/illness/exacerbation, and 3+ comorbidities: Obesity, Discoid Lupus, L3-4 & L4-5 PLIF 09/26/19, Lumbar Foraminal Stenosis, Spondylolisthesis of lumbar region, spinal stenosis lumbar, lumbar radiculopathy, GERD, Hypothyroidism, H/O Graves' disease  are also affecting patient's functional outcome.   REHAB POTENTIAL: Good  CLINICAL DECISION MAKING: Stable/uncomplicated  EVALUATION COMPLEXITY: Low   GOALS: Goals reviewed with patient? Yes  SHORT TERM GOALS: Target date: 11/16/2022  Pt will be independent with original HEP.  Baseline: TBD Goal status: IN PROGRESS  2.  Pt will verbalize an increase in confidence when walking by 25-50% to increase her QOL and increase her involvement in her community. Baseline:  Goal status: IN PROGRESS  LONG TERM GOALS: Target date: 12/07/2022  Patient will be independent with ongoing/advanced HEP for self-management at home. Baseline:  Goal status: IN PROGRESS  2.  Pt will report a score of 1600/1600 on the ABC Scale to demonstrate an increase in function and improved balance confidence. Baseline: 1530 / 1600 = 95.6 % Goal status: IN PROGRESS  3.  Pt will increase gross LE strength to a 4+/5 to address walking instability and increase confidence when performing daily activities. Baseline: see MMT chart Goal status: IN PROGRESS  4.  Pt will increase lumbar ROM to [facility] to address her inability to reach down and perform activities on the floor.  Baseline:  Goal status: IN PROGRESS  5.  Pt will verbalize an increase in confidence when walking by 50-75% to increase her QOL  and increase her involvement in her community. Baseline:  Goal status: IN PROGRESS  6.  Pt will be able to run for 10 minutes with minimal discomfort or pain.  Baseline:  Goal status: IN PROGRESS  7.  Pt will demonstrate a 24mwalk test speed of >/=1.152m to safely ambulate her community.  Baseline: 0.8127mGoal status: IN PROGRESS   PLAN:  PT FREQUENCY: 2x/week  PT DURATION: 6 weeks  PLANNED INTERVENTIONS: Therapeutic exercises, Therapeutic activity, Neuromuscular re-education, Balance training, Gait training, Patient/Family education, Self Care, Joint mobilization, Stair training, DME instructions, Dry Needling, Electrical stimulation, Spinal manipulation, Spinal mobilization, Cryotherapy, Moist heat, Taping, Ultrasound, Manual therapy, and Re-evaluation.  PLAN FOR NEXT SESSION: ask how HEP went/update as appropriate, continue proximal LE strengthening, balance and gait training, MT +/- DN if appropriate.    Arsenia Goracke Romero-Perozo, Student-PT 11/01/2022, 8:46 AM

## 2022-11-01 ENCOUNTER — Encounter: Payer: Self-pay | Admitting: Physical Therapy

## 2022-11-01 ENCOUNTER — Ambulatory Visit: Payer: Medicare Other | Admitting: Physical Therapy

## 2022-11-01 DIAGNOSIS — R2681 Unsteadiness on feet: Secondary | ICD-10-CM

## 2022-11-01 DIAGNOSIS — R2689 Other abnormalities of gait and mobility: Secondary | ICD-10-CM

## 2022-11-01 DIAGNOSIS — M6281 Muscle weakness (generalized): Secondary | ICD-10-CM

## 2022-11-04 ENCOUNTER — Ambulatory Visit: Payer: Medicare Other | Attending: Neurosurgery

## 2022-11-04 DIAGNOSIS — R2689 Other abnormalities of gait and mobility: Secondary | ICD-10-CM | POA: Insufficient documentation

## 2022-11-04 DIAGNOSIS — R2681 Unsteadiness on feet: Secondary | ICD-10-CM | POA: Diagnosis not present

## 2022-11-04 DIAGNOSIS — M6281 Muscle weakness (generalized): Secondary | ICD-10-CM | POA: Insufficient documentation

## 2022-11-04 NOTE — Therapy (Addendum)
OUTPATIENT PHYSICAL THERAPY TREATMENT / DISCHARGE SUMMARY   Patient Name: Angela Scott MRN: [id number] DOB:1951/01/24, 72 y.o., female Today's Date: 11/04/2022  END OF SESSION:  PT End of Session - 11/04/22 0931     Visit Number 3    Date for PT Re-Evaluation 12/07/22    Authorization Type Medicare & [employer] Supplement    PT Start Time 0848    PT Stop Time 0930    PT Time Calculation (min) 42 min    Activity Tolerance Patient tolerated treatment well    Behavior During Therapy [facility] for tasks assessed/performed               Past Medical History:  Diagnosis Date   Bronchitis    Difficult intubation    States x 2 with Lap Band 2007 at [facility] and at [facility] 06/2019; reports severe sore throat for both surgeries and coughed up blood post surgery x 1 week   Eustachian tube dysfunction    GERD (gastroesophageal reflux disease)    History of Graves' disease    Hypothyroidism    Lupus (HCC)    PVC's (premature ventricular contractions)    Likely outflow tract origin, LVEF normal   Past Surgical History:  Procedure Laterality Date   KNEE SURGERY Left    LAPAROSCOPIC GASTRIC BANDING WITH HIATAL HERNIA REPAIR     LUMBAR LAMINECTOMY     MANDIBLE SURGERY     MASS EXCISION Left 01/01/2015   Procedure: EXCISION MASS LEFT 1ST WEB SPACE;  Surgeon: Cindee Salt, MD;  Location: [facility];  Service: Orthopedics;  Laterality: Left;   RADIOACTIVE SEED GUIDED EXCISIONAL BREAST BIOPSY Left 11/17/2020   Procedure: LEFT BREAST SEED GUIDED EXCISIONAL BIOPSY X 2;  Surgeon: Emelia Loron, MD;  Location: [facility];  Service: General;  Laterality: Left;   SPINAL FUSION  09/2019   TONSILLECTOMY     Patient Active Problem List   Diagnosis Date Noted   Class 1 obesity due to excess calories with body mass index (BMI) of 33.0 to 33.9 in adult 11/22/2021   Tobacco use disorder, mild, abuse 06/21/2021   Atypical ductal hyperplasia of left breast  06/21/2021   Lumbar foraminal stenosis 11/20/2019   Spondylolisthesis of lumbar region 09/26/2019   Spinal stenosis of lumbar region 09/11/2019   Lumbar radiculopathy 04/09/2019   H/O Graves' disease 07/16/2013   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  PERTINENT HISTORY:  Obesity, Discoid Lupus, 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   PAIN:  Are you having pain? No  PRECAUTIONS: None  WEIGHT BEARING RESTRICTIONS: No  FALLS:  Has patient fallen in last 6 months? No  LIVING ENVIRONMENT: Lives with:  her two dogs Lives in: House/apartment Stairs: Yes: Internal: 14 steps; can reach both and External: 2 steps; can reach both Has following equipment at home: Crutches  OCCUPATION: Retired Engineer, structural ACTIVITIES: Running, Tennis   PLOF: Independent  PATIENT GOALS: get better at walking and be strong when doing her activities to get back to PLOF   OBJECTIVE:   DIAGNOSTIC FINDINGS:  10/15/22 - MRI Lumbar Spine:  Minimal grade 1 anterolisthesis of L3 on L4 and  L4 on L5.Minimal retrolisthesis of L5 on S1. At L2-3 there is a broad-based disc bulge. Moderate bilateral facet arthropathy with ligamentum flavum infolding. Moderate-severe spinal stenosis. At L3-4 and L4-5 there is interbody fusion and posterior decompression. No foraminal or central canal stenosis. At L5-S1 there is a broad-based disc bulge with a central annular fissure. Mild bilateral facet arthropathy. Mild bilateral foraminal stenosis. No acute osseous injury of the lumbar spine  10/22/22 - NCV/EMG: Electrodiagnostic evidence of a chronic lumbosacral radiculopathy, affecting the left L3, L4 and S1 nerve roots. No active denervation noted in the muscles supplied by the respective nerve roots. Recruitment is appropriate in all muscles supplied by the respective nerve roots.  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  Electrodiagnostic evidence of a left tibial motor demyelinating peripheral neuropathy.  Absent left sural sensory nerve conduction study. This can be a normal finding in age > 25 yrs old.  No electrodiagnostic evidence of a myopathy in the left lower extremity.  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  No electrodiagnostic evidence of a right tibial motor peripheral neuropathy.  Absent right sural sensory nerve conduction study. This can be a normal finding in age > 51 yrs old.  PATIENT SURVEYS:  ABC Scale: 1530 / 1600 = 95.6 %  SCREENING FOR RED FLAGS: Bowel or bladder incontinence: Yes: bladder-has some leaks Pelvic Floor exercises  Spinal tumors: No Cauda equina syndrome: No Compression fracture: No Abdominal aneurysm: No  COGNITION: Overall cognitive status: Within functional limits for tasks assessed     SENSATION Chronic numbness in her L foot since surgery, intermittent numbness on R foot  MUSCLE LENGTH: Hamstrings:  mild Thomas test: mild tight Quads: L>R mod tight Hip Extensors:mild ITB: WNL  POSTURE:  No Significant postural limitations  LUMBAR ROM:   AROM eval  Flexion WNL  Extension Iimited due to fusion  Right lateral flexion Knee   Left lateral flexion Mid thigh  Right rotation WNL  Left rotation WNL    (Blank rows = not tested)  LOWER EXTREMITY ROM:  Grossly WNL   LOWER EXTREMITY MMT:     Active  Right eval Left eval  Hip flexion 4 4  Hip extension 3+ 3+  Hip abduction 4 4  Hip adduction 4+ 4  Hip internal rotation    Hip external rotation    Knee flexion 5 5  Knee extension 5 5  Ankle dorsiflexion 5 5  Ankle plantarflexion  Ankle inversion    Ankle eversion     (Blank rows = not tested)  LUMBAR SPECIAL TESTS:  NT   FUNCTIONAL TESTS:  5 times sit to stand: 11.16 seconds  Timed up and go (TUG): 11.31 seconds 10 meter walk test: 12.28 seconds Gait speed: 2.67 ft/sec (0.81 m/s)  GAIT: Distance walked: 20 m Assistive device utilized: None Level of assistance: Complete Independence Gait pattern: decreased stance time- Left, decreased stride length, and wide BOS Comments: pt states weariness when walking, states that she just feels unstable most of the time   TODAY'S TREATMENT:                                                                                                                              DATE:  11/04/22 THERAPEUTIC EXERCISE: to improve flexibility, strength and mobility.  Verbal and tactile cues throughout for technique. NuStep L5 x6 min  Supine LTR x 10  Supine hamstring stretch with strap 2x30 sec Supine march w/ TrA RTB 2x10  Supine clams RTB x 10 Supine bridge x 10  Supine SLR x 15 bil Supine hip flexion ab raises x 10   10/31/22 THERAPEUTIC EXERCISE: to improve flexibility, strength and mobility.  Verbal and tactile cues throughout for technique. NuStep L5 x6 min  HS Stx w/ strap 2x30"  Quad Stx w/ strap 2x30" LTR x10 Supine March w/  TrA 2x10  Peanut Glute Bridge x10  Supine Glute Bridges x10  Seated hip flexion RTB w/ TrA 2x10 Seated Abduction RTB w/ TrA 2x10  Standing Hip Flx, Ext, Abd #1 at ankle 2x10  Step Ups 6" Fwd/Bwd, Lateral x10 each    10/26/22 Initial Eval    PATIENT EDUCATION:  Education details: Ongoing POC, Initial HEP Person educated: Patient Education method: Explanation, Demonstration, Tactile cues, Verbal cues, and Handouts Education comprehension: verbalized understanding, returned demonstration, verbal cues required, and needs further education  HOME EXERCISE PROGRAM: Access Code: [email or web address] URL: [email or web address] Date: 11/01/2022 Prepared by: Suzette Battiest Romero-Perozo  Exercises - Supine Hamstring Stretch with Strap  - 1 x daily - 7 x weekly - 3 sets - 30 seconds hold - Prone Quadriceps Stretch with Strap  - 1 x daily - 7 x weekly - 2 sets - 30 seconds hold - Supine Lower Trunk Rotation  - 1 x daily - 7 x weekly - 2 sets - 10 reps - Supine March  - 1 x daily - 7 x weekly - 2 sets - 10 reps - Supine Bridge  - 1 x daily - 7 x weekly - 2 sets - 10 reps - Seated Hip Flexion  - 1 x daily - 7 x weekly - 2 sets - 10 reps   ASSESSMENT:  CLINICAL IMPRESSION:  Continued with exercises focusing on lumbopelvic mobility and strength to improve functional mobility. Progressed to knee lifts in supine to emphasize more abdominal activation and added TB to supine marches. She was able to participate in all  interventions with no increased pain. She would continue to benefit from skilled therapy to improve function.  OBJECTIVE IMPAIRMENTS: Abnormal gait, decreased activity tolerance, decreased balance, decreased endurance, decreased knowledge of condition, decreased knowledge of use of DME, difficulty walking, decreased ROM, decreased strength, increased muscle spasms, impaired flexibility, and impaired sensation.   ACTIVITY LIMITATIONS: carrying, lifting, bending, standing, squatting,  stairs, and locomotion level  PARTICIPATION LIMITATIONS: cleaning, laundry, interpersonal relationship, shopping, community activity, and yard work  PERSONAL FACTORS: Age, Fitness, Past/current experiences, Time since onset of injury/illness/exacerbation, and 3+ comorbidities: Obesity, Discoid Lupus, L3-4 & L4-5 PLIF 09/26/19, Lumbar Foraminal Stenosis, Spondylolisthesis of lumbar region, spinal stenosis lumbar, lumbar radiculopathy, GERD, Hypothyroidism, H/O Graves' disease  are also affecting patient's functional outcome.   REHAB POTENTIAL: Good  CLINICAL DECISION MAKING: Stable/uncomplicated  EVALUATION COMPLEXITY: Low   GOALS: Goals reviewed with patient? Yes  SHORT TERM GOALS: Target date: 11/16/2022  Pt will be independent with original HEP.  Baseline: TBD Goal status: IN PROGRESS  2.  Pt will verbalize an increase in confidence when walking by 25-50% to increase her QOL and increase her involvement in her community. Baseline:  Goal status: IN PROGRESS  LONG TERM GOALS: Target date: 12/07/2022  Patient will be independent with ongoing/advanced HEP for self-management at home. Baseline:  Goal status: IN PROGRESS  2.  Pt will report a score of 1600/1600 on the ABC Scale to demonstrate an increase in function and improved balance confidence. Baseline: 1530 / 1600 = 95.6 % Goal status: IN PROGRESS  3.  Pt will increase gross LE strength to a 4+/5 to address walking instability and increase confidence when performing daily activities. Baseline: see MMT chart Goal status: IN PROGRESS  4.  Pt will increase lumbar ROM to [facility] to address her inability to reach down and perform activities on the floor.  Baseline:  Goal status: IN PROGRESS  5.  Pt will verbalize an increase in confidence when walking by 50-75% to increase her QOL and increase her involvement in her community. Baseline:  Goal status: IN PROGRESS  6.  Pt will be able to run for 10 minutes with minimal discomfort  or pain.  Baseline:  Goal status: IN PROGRESS  7.  Pt will demonstrate a 83m walk test speed of >/=1.11m/s to safely ambulate her community.  Baseline: 0.43m/s Goal status: IN PROGRESS   PLAN:  PT FREQUENCY: 2x/week  PT DURATION: 6 weeks  PLANNED INTERVENTIONS: Therapeutic exercises, Therapeutic activity, Neuromuscular re-education, Balance training, Gait training, Patient/Family education, Self Care, Joint mobilization, Stair training, DME instructions, Dry Needling, Electrical stimulation, Spinal manipulation, Spinal mobilization, Cryotherapy, Moist heat, Taping, Ultrasound, Manual therapy, and Re-evaluation.  PLAN FOR NEXT SESSION: ask how HEP went/update as appropriate, continue proximal LE strengthening, balance and gait training, MT +/- DN if appropriate.    Darleene Cleaver, PTA 11/04/2022, 9:32 AM   PHYSICAL THERAPY DISCHARGE SUMMARY  Visits from Start of Care: 3  Current functional level related to goals / functional outcomes: Refer to above clinical impression and goal assessment for status as of last visit on 11/04/22. On 11/10/22, she cancelled all of her remaining appointments, stating she wanted to try ozone therapy. She requested to be placed on hold for 30 days and has not needed to return to PT, therefore will proceed with discharge from PT for this episode.    Remaining deficits: As above. Unable to formally assess status at discharge due to failure to return to PT.   Education / Equipment: Initial HEP  Patient agrees to discharge. Patient goals were not met. Patient is being discharged due to not returning since the last visit.  Marry Guan, PT 02/09/23, 12:37 PM  [facility] [address]  [address] [place], [place], [address] Phone: [phone]   Fax:  [phone]

## 2022-11-08 ENCOUNTER — Encounter: Payer: Medicare Other | Admitting: Physical Therapy

## 2022-11-11 ENCOUNTER — Encounter: Payer: Medicare Other | Admitting: Physical Therapy

## 2022-11-16 ENCOUNTER — Ambulatory Visit: Payer: Medicare Other

## 2022-11-22 ENCOUNTER — Encounter: Payer: Medicare Other | Admitting: Physical Therapy

## 2022-11-29 ENCOUNTER — Encounter: Payer: Medicare Other | Admitting: Physical Therapy

## 2024-03-27 ENCOUNTER — Telehealth: Payer: Self-pay | Admitting: Cardiology

## 2024-03-27 NOTE — Telephone Encounter (Signed)
 Called pt to schedule overdue recall w/Dr. Debera. Pt stated at first she did not want to make an appt. She has been going through a hard time with her weight and feels like no one and nothing is helping her. The patient stated she does not want to see obese/obesity on her paperwork that she is given after appts. Pt did decide to make appt. She is scheduled for 05/15/2024 w/Dr. Debera. She asked that I send a message to see if in the meantime there is something Dr. Debera suggests she does, like and testing. She is also wanting to see if she can get some referrals at that appt to an immunologist or metabolic doctor.

## 2024-03-28 NOTE — Telephone Encounter (Signed)
 Spoke with pt and informed on Dr. Madalyn response.

## 2024-05-15 ENCOUNTER — Ambulatory Visit: Attending: Cardiology | Admitting: Cardiology

## 2024-05-15 ENCOUNTER — Encounter: Payer: Self-pay | Admitting: Cardiology

## 2024-05-15 VITALS — BP 148/92 | HR 76 | Ht 65.0 in | Wt 229.0 lb

## 2024-05-15 DIAGNOSIS — I493 Ventricular premature depolarization: Secondary | ICD-10-CM | POA: Diagnosis not present

## 2024-05-15 DIAGNOSIS — Z8639 Personal history of other endocrine, nutritional and metabolic disease: Secondary | ICD-10-CM | POA: Insufficient documentation

## 2024-05-15 NOTE — Progress Notes (Signed)
    Cardiology Office Note  Date: 05/15/2024   ID: Donni, Oglesby 03/11/1948, MRN [id number]  History of Present Illness: Angela Scott is a 73 y.o. female last seen in November 2023.  She presents for a routine visit.  She tells me that she has been following with holistic health care provider over the last few years in [place].  She complains of chronic fatigue, states that she feels good when she wakes up in the morning, but is tired within a few hours just doing house chores.  No exertional chest pain or unusual shortness of breath however.  No palpitations or syncope.  Medications reviewed.  She is on Synthroid  88 mcg daily and Cytomel  25 mcg daily.  TSH by lab work in [place] from November of last year was elevated at 27.9.  Inflammatory markers were normal at that time as well as hemoglobin.  She tells me that she would like to find a new PCP and we discussed planning to get a full physical with repeat lab work and attention to thyroid  status at that time.  I reviewed her ECG today which shows normal sinus rhythm.  Physical Exam: VS:  BP (!) 148/92 (BP Location: Right Arm, Patient Position: Sitting, Cuff Size: Large)   Pulse 76   Ht 5' 5 (1.651 m)   Wt 229 lb (103.9 kg)   LMP  (LMP Unknown)   SpO2 97%   BMI 38.11 kg/m , BMI Body mass index is 38.11 kg/m.  Wt Readings from Last 3 Encounters:  05/15/24 229 lb (103.9 kg)  11/22/21 203 lb (92.1 kg)  06/21/21 201 lb 14.4 oz (91.6 kg)    General: Patient appears comfortable at rest. HEENT: Conjunctiva and lids normal. Neck: Supple, no elevated JVP or carotid bruits. Lungs: Clear to auscultation, nonlabored breathing at rest. Cardiac: Regular rate and rhythm, no S3 or significant systolic murmur, no pericardial rub.  ECG:  An ECG dated 08/03/2022 was personally reviewed today and demonstrated:  Sinus rhythm with leftward axis and low voltage.  Labwork:  November 2024: Hemoglobin A1c 5.5%, TSH 27.9, free T4 0.95,  T3 13.9, ESR 11, CRP 2, hemoglobin 13.6, platelets 343, BUN 17, creatinine 0.87, potassium 4.3, AST 24, ALT 12  Other Studies Reviewed Today:  No interval cardiac testing for review today.  Assessment and Plan:  1.  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  LVEF 60 to 65% and RV function normal as of 2020.  This has improved significantly, she reports no active palpitations.  She thinks in retrospect it could have been related to previous supplements that she was taking.  ECG shows normal sinus rhythm today and she has no ectopy on cardiac examination.  Will continue with strategy of observation at this point.  2.  Hypothyroidism by history.  TSH was 27.9 in November of last year.  Currently on Synthroid  88 mcg daily.  I have recommended that she establish with PCP for full physical and repeat lab work.  Disposition:  Follow up 1 year.  Signed, Jayson JUDITHANN Sierras, M.D., F.A.C.C. [facility] at [facility]

## 2024-05-15 NOTE — Patient Instructions (Signed)
Medication Instructions:  Your physician recommends that you continue on your current medications as directed. Please refer to the Current Medication list given to you today.   Labwork: None today  Testing/Procedures: None today  Follow-Up: 1 year Dr.McDowell  Any Other Special Instructions Will Be Listed Below (If Applicable).  If you need a refill on your cardiac medications before your next appointment, please call your pharmacy.

## 2024-05-15 NOTE — Progress Notes (Signed)
 EKG

## 2024-09-12 ENCOUNTER — Other Ambulatory Visit (HOSPITAL_COMMUNITY): Payer: Self-pay | Admitting: General Surgery

## 2024-09-12 DIAGNOSIS — Z9884 Bariatric surgery status: Secondary | ICD-10-CM

## 2024-09-18 ENCOUNTER — Other Ambulatory Visit (HOSPITAL_COMMUNITY): Payer: Self-pay | Admitting: General Surgery

## 2024-09-18 ENCOUNTER — Ambulatory Visit (HOSPITAL_COMMUNITY)
Admission: RE | Admit: 2024-09-18 | Discharge: 2024-09-18 | Disposition: A | Discharge status: Home / Self Care | Source: Ambulatory Visit | Attending: General Surgery | Admitting: General Surgery

## 2024-09-18 DIAGNOSIS — Z9884 Bariatric surgery status: Secondary | ICD-10-CM | POA: Insufficient documentation

## 2024-09-20 ENCOUNTER — Telehealth: Payer: Self-pay | Admitting: Cardiology

## 2024-09-20 ENCOUNTER — Telehealth: Payer: Self-pay

## 2024-09-20 NOTE — Telephone Encounter (Signed)
"  ° °  Pre-operative Risk Assessment    Patient Name: Angela Scott  DOB: 1951/01/25 MRN: [id number]   Date of last office visit: 05/15/2024, Dr. Jayson Sierras, MD Date of next office visit: NONE   Request for Surgical Clearance    Procedure:  Lap band removal  Date of Surgery:  Clearance TBD                                Surgeon: Dr. Camellia Blush, MD Surgeon's Group or Practice Name: [facility]  Phone number: [phone] Fax number: [phone]   Type of Clearance Requested:   - Medical    Type of Anesthesia:  General    Additional requests/questions:    SignedAsberry KANDICE Dunning   09/20/2024, 11:38 AM   "

## 2024-09-20 NOTE — Telephone Encounter (Signed)
 Called and spoke to the patient to schedule her for pre-op clearance. She stated, I was just seen in office with Dr. Debera in November and I have to come back? I explained to her that she would just need a telephone visit for her to have clearance. She then proceeded to raise her voice at me. She said, Well! I have been on hold for the past hour an a half just to speak to someone to get this done! Can we do this today? I explained to her no unfortunately we couldn't. Well of course you can't! That's the problem when you cram [phone] people into your screwed up healthcare system! That's your problem! She was very angry saying this. She demanded to be spoken to first available Tuesday since we are done on Monday. I told her we were open. Good! You can clear me Monday morning then! I told her unfortunately we didn't have anything left. Of course not! That's the problem! You book everyone at weird times! That is your problem! I worked in healthcare, I have NEVER seen a more unorganized healthcare system than yours! I tried to explain to her we had availability on 09/25/24, and she took the time. She asked me if Dr. Debera would be the one to clear her. I told her it would not, I told her it would be one of our nurse practitioners. She proceeded to raise her voice at me again, You are putting me with a nurse practitioner or physician assistant who knows nothing about me! They are very incompetent and know nothing! Doctors are the ones who clear people for surgery, NOT the nurse practitioners! I told her that the provider will brief herself on her chart and see what needs to be done. When we went over her medication list she was very angry going over those with me as well. I told her who she would be speaking to for clearance and she was very angry. I tried to calm her down and she was not happy when the call ended. I told her this conversation would be documented. She hung up.      Patient Consent  for Virtual Visit        Angela Scott has provided verbal consent on 09/20/2024 for a virtual visit (video or telephone).   CONSENT FOR VIRTUAL VISIT FOR:  Angela Scott  By participating in this virtual visit I agree to the following:  I hereby voluntarily request, consent and authorize [facility] and its employed or contracted physicians, physician assistants, nurse practitioners or other licensed health care professionals (the Practitioner), to provide me with telemedicine health care services (the Services) as deemed necessary by the treating Practitioner. I acknowledge and consent to receive the Services by the Practitioner via telemedicine. I understand that the telemedicine visit will involve communicating with the Practitioner through live audiovisual communication technology and the disclosure of certain medical information by electronic transmission. I acknowledge that I have been given the opportunity to request an in-person assessment or other available alternative prior to the telemedicine visit and am voluntarily participating in the telemedicine visit.  I understand that I have the right to withhold or withdraw my consent to the use of telemedicine in the course of my care at any time, without affecting my right to future care or treatment, and that the Practitioner or I may terminate the telemedicine visit at any time. I understand that I have the right to inspect all information obtained and/or recorded  in the course of the telemedicine visit and may receive copies of available information for a reasonable fee.  I understand that some of the potential risks of receiving the Services via telemedicine include:  Delay or interruption in medical evaluation due to technological equipment failure or disruption; Information transmitted may not be sufficient (e.g. poor resolution of images) to allow for appropriate medical decision making by the Practitioner; and/or  In rare  instances, security protocols could fail, causing a breach of personal health information.  Furthermore, I acknowledge that it is my responsibility to provide information about my medical history, conditions and care that is complete and accurate to the best of my ability. I acknowledge that Practitioner's advice, recommendations, and/or decision may be based on factors not within their control, such as incomplete or inaccurate data provided by me or distortions of diagnostic images or specimens that may result from electronic transmissions. I understand that the practice of medicine is not an exact science and that Practitioner makes no warranties or guarantees regarding treatment outcomes. I acknowledge that a copy of this consent can be made available to me via my patient portal [facility] MyChart), or I can request a printed copy by calling the office of [facility].    I understand that my insurance will be billed for this visit.   I have read or had this consent read to me. I understand the contents of this consent, which adequately explains the benefits and risks of the Services being provided via telemedicine.  I have been provided ample opportunity to ask questions regarding this consent and the Services and have had my questions answered to my satisfaction. I give my informed consent for the services to be provided through the use of telemedicine in my medical care

## 2024-09-20 NOTE — Telephone Encounter (Signed)
"  Pt returned your call, please advise.  "

## 2024-09-20 NOTE — Telephone Encounter (Signed)
 Attempted to call patient and was about to leave a voicemail when the call was stopped. Will try again later.

## 2024-09-20 NOTE — Telephone Encounter (Signed)
 Pre-op clearance on 09/25/24 with Barnie Hila, NP Will remove from our pool

## 2024-09-20 NOTE — Telephone Encounter (Signed)
" ° °  Name: Angela Scott  DOB: 1950/09/23  MRN: [id number]  Primary Cardiologist: Jayson Sierras, MD   Preoperative team, please contact this patient and set up a phone call appointment for further preoperative risk assessment. Please obtain consent and complete medication review. Thank you for your help.  I confirm that guidance regarding antiplatelet and oral anticoagulation therapy has been completed and, if necessary, noted below. - She is not on any antiplatelet/ anticoagulation therapy.   I also confirmed the patient resides in the state of [place] . As per [facility] Medical Board telemedicine laws, the patient must reside in the state in which the provider is licensed.   Angela Scott E Rutilio Yellowhair, PA-C 09/20/2024, 2:27 PM [facility]    "

## 2024-09-23 ENCOUNTER — Encounter: Payer: Self-pay | Admitting: Cardiology

## 2024-09-23 ENCOUNTER — Telehealth: Payer: Self-pay

## 2024-09-23 NOTE — Telephone Encounter (Signed)
 Called patient to move her pre-op clearance due to a scheduling issue of accidentally double booking the patient. I explained to her our next availability and apologized to her. She became very angry and said, You aren't sorry, I would like to have the clearance done today. But no, you are going to put me with someone else who knows nothing about me and what I am going through. What is your name and your credentials? I will be taking this up with someone higher up than you. I told her I didn't feel it was appropriate or necessary to give my credentials to her. She became more frustrated and demanded to know if I would be the one to clear her. I told her it would not be me, but one of our nurse practioners named Barnie Hila. I did offer an apology again to the scheduling issue. She said very angrily, Don't acolyte yourself. And hung up the phone.

## 2024-09-25 ENCOUNTER — Ambulatory Visit

## 2024-09-25 NOTE — Telephone Encounter (Addendum)
 Angela Scott [place], [place]    09/23/24  5:22 PM Note Called patient to move her pre-op clearance due to a scheduling issue of accidentally double booking the patient. I explained to her our next availability and apologized to her. She became very angry and said, You aren't sorry, I would like to have the clearance done today. But no, you are going to put me with someone else who knows nothing about me and what I am going through. What is your name and your credentials? I will be taking this up with someone higher up than you. I told her I didn't feel it was appropriate or necessary to give my credentials to her. She became more frustrated and demanded to know if I would be the one to clear her. I told her it would not be me, but one of our nurse practioners named Barnie Hila. I did offer an apology again to the scheduling issue. She said very angrily, Don't acolyte yourself. And hung up the phone.              Angela Scott [place], [place]    09/20/24  4:49 PM Note Pre-op clearance on 09/25/24 with Barnie Hila, NP Will remove from our pool     Angela Scott MATSU, [place]    09/20/24  4:48 PM Note Called and spoke to the patient to schedule her for pre-op clearance. She stated, I was just seen in office with Dr. Debera in November and I have to come back? I explained to her that she would just need a telephone visit for her to have clearance. She then proceeded to raise her voice at me. She said, Well! I have been on hold for the past hour an a half just to speak to someone to get this done! Can we do this today? I explained to her no unfortunately we couldn't. Well of course you can't! That's the problem when you cram [phone] people into your screwed up healthcare system! That's your problem! She was very angry saying this. She demanded to be spoken to first available Tuesday since we are done on Monday. I told her we were open. Good! You can clear me Monday morning then! I  told her unfortunately we didn't have anything left. Of course not! That's the problem! You book everyone at weird times! That is your problem! I worked in healthcare, I have NEVER seen a more unorganized healthcare system than yours! I tried to explain to her we had availability on 09/25/24, and she took the time. She asked me if Dr. Debera would be the one to clear her. I told her it would not, I told her it would be one of our nurse practitioners. She proceeded to raise her voice at me again, You are putting me with a nurse practitioner or physician assistant who knows nothing about me! They are very incompetent and know nothing! Doctors are the ones who clear people for surgery, NOT the nurse practitioners! I told her that the provider will brief herself on her chart and see what needs to be done. When we went over her medication list she was very angry going over those with me as well. I told her who she would be speaking to for clearance and she was very angry. I tried to calm her down and she was not happy when the call ended. I told her this conversation would be documented. She hung up.

## 2024-09-25 NOTE — Telephone Encounter (Signed)
 Pt has tele appt already 09/27/24, this has been addressed

## 2024-09-25 NOTE — Telephone Encounter (Signed)
 I s/w the pt and tried to s/w the pt and she is not wanting to listen to any of our explanations and to try and explain the preop process. The pt states she though the preop request was going to go to Dr. Debera directly. She is very upset with the whole process and does not understand why she is going to s/w someone who does not know her is going to evaluate her.    She said she sent Dr. Debera a message thru [facility] CHART and wondered why someone else was calling her. She wanted to know how she can s/w Dr. Debera herself. I offered her that I can send Dr. Debera a message to see if he can call her and she said no. We discussed further the process.   Our conversation was very good at the end and pt agrees to the tele appt Barnie Hila, NP 09/27/24.

## 2024-09-25 NOTE — Telephone Encounter (Signed)
 Helping in preop today. Will route to callback team to assist. There is already an open Clearance request on 09/20/24 for this patient where callback team was trying to reach patient.

## 2024-09-27 ENCOUNTER — Ambulatory Visit: Attending: Cardiovascular Disease | Admitting: Student

## 2024-09-27 DIAGNOSIS — Z01810 Encounter for preprocedural cardiovascular examination: Secondary | ICD-10-CM

## 2024-09-27 NOTE — Progress Notes (Signed)
 "   Virtual Visit via Telephone Note   Because of Angela Scott's co-morbid illnesses, she is at least at moderate risk for complications without adequate follow up.  This format is felt to be most appropriate for this patient at this time.  The patient did not have access to video technology/had technical difficulties with video requiring transitioning to audio format only (telephone).  All issues noted in this document were discussed and addressed.  No physical exam could be performed with this format.  Please refer to the patient's chart for her consent to telehealth for [facility].  Evaluation Performed:  Preoperative cardiovascular risk assessment _____________   Date:  09/27/2024   Patient ID:  Angela Scott, DOB [birth year], MRN [id number] Patient Location:  Home Provider location:   Office  Primary Care Provider:  Thedora Garnette HERO, MD  Primary Cardiologist:  [facility] Providers Cardiologist:  Jayson Sierras, MD   Chief Complaint / Patient Profile   74 y.o. 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 Blush and presents today for telephonic preoperative cardiovascular risk assessment.  History of Present Illness    Angela Scott is a 74 y.o. female who presents via audio/video conferencing for a telehealth visit today.  Pt was last seen in cardiology clinic on 05/15/2024 by Dr. Sierras.  At that time [name] was stable from a cardiac standpoint.  The patient is now pending procedure as outlined above. Since her last visit, she is doing well. Patient denies shortness of breath, dyspnea on exertion, lower extremity edema, orthopnea or PND. No chest pain, pressure, or tightness. No palpitations. She is very active with a treadmill and rowing machine at home. She also recently started working with a systems analyst 3 days a week and performs floor exercises with stretching 2 days a week.    Past Medical History    Past Medical History:  Diagnosis Date   Bronchitis    Difficult intubation    States x 2 with Lap Band 2007 at [facility] and at [facility] 06/2019; reports severe sore throat for both surgeries and coughed up blood post surgery x 1 week   Eustachian tube dysfunction    GERD (gastroesophageal reflux disease)    History of Graves' disease    Hypothyroidism    Lupus    PVC's (premature ventricular contractions)    Likely outflow tract origin, LVEF normal   Past Surgical History:  Procedure Laterality Date   KNEE SURGERY Left    LAPAROSCOPIC GASTRIC BANDING WITH HIATAL HERNIA REPAIR     LUMBAR LAMINECTOMY     MANDIBLE SURGERY     MASS EXCISION Left 01/01/2015   Procedure: EXCISION MASS LEFT 1ST WEB SPACE;  Surgeon: [name], MD;  Location: [facility];  Service: Orthopedics;  Laterality: Left;   RADIOACTIVE SEED GUIDED EXCISIONAL BREAST BIOPSY Left 11/17/2020   Procedure: LEFT BREAST SEED GUIDED EXCISIONAL BIOPSY X 2;  Surgeon: [name], MD;  Location: [facility];  Service: General;  Laterality: Left;   SPINAL FUSION  09/2019   TONSILLECTOMY      Allergies  Allergies[1]  Home Medications    Prior to Admission medications  Medication Sig Start Date End Date Taking? Authorizing Provider  CYTOMEL  25 MCG tablet Take 37.5 mcg by mouth every morning. 1.5 daily 10/06/21   [provider]  fluticasone  (FLONASE ) 50 MCG/ACT nasal spray Place  2 sprays into both nostrils daily. Patient not taking: Reported on 10/26/2022 06/28/22   [name], MD  ibuprofen  (ADVIL ) 400 MG tablet Take 400 mg by mouth as needed. Patient not taking: Reported on 09/20/2024    [provider]  Multiple Vitamin (MULTIVITAMIN WITH MINERALS) TABS tablet Take 1 tablet by mouth daily.    [provider]  SYNTHROID  88 MCG tablet Take 88 mcg by mouth every morning. 07/06/22   [provider]  vitamin  B-12 (CYANOCOBALAMIN) 100 MCG tablet Take 100 mcg by mouth daily.    [provider]  VITAMIN D-VITAMIN K PO Take by mouth.    [provider]    Physical Exam    Vital Signs:  [name] does not have vital signs available for review today.  Given telephonic nature of communication, physical exam is limited. AAOx3. NAD. Normal affect.  Speech and respirations are unlabored.   Assessment & Plan    Preoperative cardiovascular risk assessment. Lab band removal by Dr. Blush  Chart reviewed as part of pre-operative protocol coverage. According to the RCRI, patient has a 0.4% risk of MACE. Patient reports activity equivalent to >4.0 METS (works with a systems analyst 3 days a week, performs floor exercises including stretching 2 days a week).   Given past medical history and time since last visit, based on ACC/AHA guidelines, ARLETTE SCHAAD would be at acceptable risk for the planned procedure without further cardiovascular testing.   Patient was advised that if she develops new symptoms prior to surgery to contact our office to arrange a follow-up appointment.  she verbalized understanding.  I will route this recommendation to the requesting party via Epic fax function.  Please call with questions.  Time:   Today, I have spent 5 minutes with the patient with telehealth technology discussing medical history, symptoms, and management plan.     Barnie Hila, NP  09/27/2024, 8:17 AM     [1]  Allergies Allergen Reactions   Clindamycin/Lincomycin     C-Dif toxicity   Methotrexate And Trimetrexate Anaphylaxis    Fever,lymphadnopathy   Augmentin  [Amoxicillin -Pot Clavulanate] Nausea And Vomiting    Generic brands only   Other     absorbable sutures; Reports they do not absorb and create an abscess.    Pneumococcal Vaccines     Patient states she is allergic to all vaccines due to her lupus   "

## 2024-11-04 ENCOUNTER — Ambulatory Visit (HOSPITAL_COMMUNITY): Admit: 2024-11-04 | Admitting: General Surgery
